# Patient Record
Sex: Male | Born: 1965
Health system: Southern US, Community
[De-identification: ages and names within clinical notes are randomized; demographics above are authoritative.]

## PROBLEM LIST (undated history)

## (undated) DIAGNOSIS — F419 Anxiety disorder, unspecified: Secondary | ICD-10-CM

## (undated) DIAGNOSIS — T7840XA Allergy, unspecified, initial encounter: Secondary | ICD-10-CM

## (undated) DIAGNOSIS — M1990 Unspecified osteoarthritis, unspecified site: Secondary | ICD-10-CM

## (undated) HISTORY — DX: Allergy, unspecified, initial encounter: T78.40XA

## (undated) HISTORY — DX: Anxiety disorder, unspecified: F41.9

## (undated) HISTORY — DX: Unspecified osteoarthritis, unspecified site: M19.90

## (undated) HISTORY — PX: HERNIA REPAIR: SHX51

## (undated) HISTORY — PX: JOINT REPLACEMENT: SHX530

---
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
## 2017-09-15 DIAGNOSIS — H6692 Otitis media, unspecified, left ear: Secondary | ICD-10-CM | POA: Diagnosis not present

## 2017-09-15 DIAGNOSIS — R0982 Postnasal drip: Secondary | ICD-10-CM | POA: Diagnosis not present

## 2017-10-22 LAB — HEMOGLOBIN A1C: Hemoglobin A1C: 5.2

## 2017-10-22 LAB — LIPID PANEL
CHOLESTEROL: 315 — AB (ref 0–200)
HDL: 42 (ref 35–70)
LDL Cholesterol: 228
TRIGLYCERIDES: 223 — AB (ref 40–160)

## 2017-10-22 LAB — BASIC METABOLIC PANEL
CREATININE: 1 (ref 0.6–1.3)
GLUCOSE: 96

## 2017-11-22 ENCOUNTER — Encounter: Payer: Self-pay | Admitting: Family Medicine

## 2017-11-22 ENCOUNTER — Ambulatory Visit (INDEPENDENT_AMBULATORY_CARE_PROVIDER_SITE_OTHER): Payer: BLUE CROSS/BLUE SHIELD | Admitting: Family Medicine

## 2017-11-22 VITALS — BP 140/84 | HR 101 | Temp 98.0°F | Resp 16 | Ht 70.5 in | Wt 215.0 lb

## 2017-11-22 DIAGNOSIS — Z23 Encounter for immunization: Secondary | ICD-10-CM | POA: Diagnosis not present

## 2017-11-22 DIAGNOSIS — F901 Attention-deficit hyperactivity disorder, predominantly hyperactive type: Secondary | ICD-10-CM

## 2017-11-22 DIAGNOSIS — Z9189 Other specified personal risk factors, not elsewhere classified: Secondary | ICD-10-CM | POA: Insufficient documentation

## 2017-11-22 DIAGNOSIS — Z7252 High risk homosexual behavior: Secondary | ICD-10-CM | POA: Insufficient documentation

## 2017-11-22 DIAGNOSIS — E785 Hyperlipidemia, unspecified: Secondary | ICD-10-CM

## 2017-11-22 DIAGNOSIS — H7292 Unspecified perforation of tympanic membrane, left ear: Secondary | ICD-10-CM | POA: Insufficient documentation

## 2017-11-22 DIAGNOSIS — J302 Other seasonal allergic rhinitis: Secondary | ICD-10-CM

## 2017-11-22 DIAGNOSIS — E669 Obesity, unspecified: Secondary | ICD-10-CM | POA: Diagnosis not present

## 2017-11-22 DIAGNOSIS — F988 Other specified behavioral and emotional disorders with onset usually occurring in childhood and adolescence: Secondary | ICD-10-CM | POA: Insufficient documentation

## 2017-11-22 DIAGNOSIS — F411 Generalized anxiety disorder: Secondary | ICD-10-CM

## 2017-11-22 DIAGNOSIS — J3089 Other allergic rhinitis: Secondary | ICD-10-CM | POA: Diagnosis not present

## 2017-11-22 DIAGNOSIS — Z1211 Encounter for screening for malignant neoplasm of colon: Secondary | ICD-10-CM

## 2017-11-22 DIAGNOSIS — H938X2 Other specified disorders of left ear: Secondary | ICD-10-CM

## 2017-11-22 MED ORDER — DIPHENHYDRAMINE HCL 25 MG PO TABS: 25.0000 mg | ORAL_TABLET | Freq: Four times a day (QID) | ORAL | 0 refills | Status: DC | PRN

## 2017-11-22 MED ORDER — LEVOCETIRIZINE DIHYDROCHLORIDE 5 MG PO TABS: 5.0000 mg | ORAL_TABLET | Freq: Every evening | ORAL | 1 refills | Status: DC

## 2017-11-22 MED ORDER — DULOXETINE HCL 30 MG PO CPEP: 30.0000 mg | ORAL_CAPSULE | Freq: Every day | ORAL | 0 refills | Status: DC

## 2017-11-22 MED ORDER — EMTRICITABINE-TENOFOVIR DF 200-300 MG PO TABS: 1.0000 | ORAL_TABLET | Freq: Every day | ORAL | 12 refills | Status: DC

## 2017-11-22 MED ORDER — ASPIRIN EC 81 MG PO TBEC: 81.0000 mg | DELAYED_RELEASE_TABLET | Freq: Every day | ORAL | 0 refills | Status: DC

## 2017-11-22 MED ORDER — MULTI-VITAMIN/MINERALS PO TABS: 1.0000 | ORAL_TABLET | Freq: Every day | ORAL | 0 refills | Status: AC

## 2017-11-22 MED ORDER — ROSUVASTATIN CALCIUM 20 MG PO TABS: 20.0000 mg | ORAL_TABLET | Freq: Every day | ORAL | 2 refills | Status: DC

## 2017-11-22 MED ORDER — MOMETASONE FUROATE 50 MCG/ACT NA SUSP: 2.0000 | Freq: Every day | NASAL | 2 refills | Status: DC

## 2017-11-22 NOTE — Progress Notes (Signed)
Name: Danny Fisher   MRN: [id number]    DOB: Aug 05, 1965   Date:11/22/2017       Progress Note  Subjective  Chief Complaint  Chief Complaint  Patient presents with  . Establish Care    HPI  Dyslipidemia: he denies significant history of heart disease , but mother has dyslipidemia and takes cholesterol medication and grandmother had a stroke in her 61's. He denies chest pain or palpitation. He is over 73 and is vaping with a history of cigarette use in the past. BP elevated today ( he states very anxious) and was 130/81 at health screen at work. His ASCVD is very high with life time risk of 69 % and because LDL above 200, high dose statin is recommended. He is willing to try therapy.  Elevation BP: bp was 130/81 at recent work screening, elevated today but he was nervous, he will return for bp recheck with CMA or in a month with me.   Obesity: discussed importance of life style modification  AR: taking otc medication , he states had an URI in July that was severe since that episode he has intermittent left ear fulness, he has a history of left TM rupture and would like to see ENT. He is taking otc medication for AR for many years, has stuffy nose every night  GAD: he states that he was diagnosed with ADD , he continues to be fidgety, anxious, chews his nails, bites the inside of his gum, has difficulty sleeping without benadryl, never took medication for anxiety, but has some agoraphobia.   Homosexual: married, but states occasional has sex with others, however not recently, discussed [place]    Patient Active Problem List   Diagnosis Date Noted  . Dyslipidemia 11/22/2017  . Tympanic membrane rupture, left 11/22/2017  . Obesity (BMI 30.0-34.9) 11/22/2017  . High risk homosexual behavior 11/22/2017  . ADD (attention deficit disorder) 11/22/2017    Past Surgical History:  Procedure Laterality Date  . INCISIONAL HERNIA REPAIR  1971    Family History  Problem Relation Age of Onset  .  Hyperlipidemia Mother   . Ovarian cancer Maternal Grandmother   . Emphysema Maternal Grandfather        Tobacco User  . Stroke Paternal Grandmother     Social History   Socioeconomic History  . Marital status: Married    Spouse name: [name]  . Number of children: 0  . Years of education: Not on file  . Highest education level: Bachelor's degree (e.g., BA, AB, BS)  Occupational History  . Not on file  Social Needs  . Financial resource strain: Not hard at all  . Food insecurity:    Worry: Never true    Inability: Never true  . Transportation needs:    Medical: No    Non-medical: No  Tobacco Use  . Smoking status: Former Smoker    Packs/day: 0.50    Years: 25.00    Pack years: 12.50    Types: Cigarettes    Start date: 03/22/1984    Last attempt to quit: 11/22/2009    Years since quitting: 8.0  . Smokeless tobacco: Never Used  Substance and Sexual Activity  . Alcohol use: Yes    Frequency: Never    Comment: very seldom   . Drug use: Never  . Sexual activity: Yes    Partners: Male  Lifestyle  . Physical activity:    Days per week: 0 days    Minutes per session: Not on file  .  Stress: Very much  Relationships  . Social connections:    Talks on phone: More than three times a week    Gets together: Once a week    Attends religious service: Never    Active member of club or organization: No    Attends meetings of clubs or organizations: Never    Relationship status: Married  . Intimate partner violence:    Fear of current or ex partner: No    Emotionally abused: No    Physically abused: No    Forced sexual activity: No  Other Topics Concern  . Not on file  Social History Narrative   He is married to Hough, he has working for [employer] for 10 years   Anxiety keeps him from going out often     Current Outpatient Medications:  .  aspirin EC 81 MG tablet, Take 1 tablet (81 mg total) by mouth daily., Disp: 30 tablet, Rfl: 0 .  diphenhydrAMINE (BENADRYL ALLERGY) 25 MG  tablet, Take 1 tablet (25 mg total) by mouth every 6 (six) hours as needed., Disp: 30 tablet, Rfl: 0 .  DULoxetine (CYMBALTA) 30 MG capsule, Take 1-2 capsules (30-60 mg total) by mouth daily. First week and after that 2 daily, Disp: 60 capsule, Rfl: 0 .  emtricitabine-tenofovir (TRUVADA) 200-300 MG tablet, Take 1 tablet by mouth daily., Disp: 30 tablet, Rfl: 12 .  levocetirizine (XYZAL) 5 MG tablet, Take 1 tablet (5 mg total) by mouth every evening., Disp: 90 tablet, Rfl: 1 .  mometasone (NASONEX) 50 MCG/ACT nasal spray, Place 2 sprays into the nose daily., Disp: 17 g, Rfl: 2 .  Multiple Vitamins-Minerals (MULTIVITAMIN WITH MINERALS) tablet, Take 1 tablet by mouth daily., Disp: 30 tablet, Rfl: 0 .  rosuvastatin (CRESTOR) 20 MG tablet, Take 1 tablet (20 mg total) by mouth daily., Disp: 30 tablet, Rfl: 2  No Known Allergies   ROS  Constitutional: Negative for fever or weight change.  Respiratory: Negative for cough and shortness of breath.   Cardiovascular: Negative for chest pain or palpitations.  Gastrointestinal: Negative for abdominal pain, no bowel changes.  Musculoskeletal: Negative for gait problem or joint swelling.  Skin: Negative for rash.  Neurological: Negative for dizziness or headache.  No other specific complaints in a complete review of systems (except as listed in HPI above).  Objective  Vitals:   11/22/17 1501  BP: (!) 144/86  Pulse: (!) 114  Resp: 16  Temp: 98 F (36.7 C)  TempSrc: Oral  SpO2: 98%  Weight: 215 lb (97.5 kg)  Height: 5' 10.5" (1.791 m)    Body mass index is 30.41 kg/m.  Physical Exam  Constitutional: Patient appears well-developed and well-nourished. Obese  No distress.  HEENT: head atraumatic, normocephalic, pupils equal and reactive to light,  neck supple, throat within normal limits, left TM air fluid level and scarring  Cardiovascular: Normal rate, regular rhythm and normal heart sounds.  No murmur heard. No BLE edema. Pulmonary/Chest:  Effort normal and breath sounds normal. No respiratory distress. Abdominal: Soft.  There is no tenderness. Psychiatric: Patient has a normal mood and affect. behavior is normal. Judgment and thought content normal.  Recent Results (from the past 2160 hour(s))  Basic metabolic panel     Status: None   Collection Time: 10/22/17 12:00 AM  Result Value Ref Range   Glucose 96    Creatinine 1.0 0.6 - 1.3  Lipid panel     Status: Abnormal   Collection Time: 10/22/17 12:00 AM  Result  Value Ref Range   Triglycerides 223 (A) 40 - 160   Cholesterol 315 (A) 0 - 200   HDL 42 35 - 70   LDL Cholesterol 228   Hemoglobin A1c     Status: None   Collection Time: 10/22/17 12:00 AM  Result Value Ref Range   Hemoglobin A1C 5.2      PHQ2/9: Depression screen PHQ 2/9 11/22/2017  Decreased Interest 0  Down, Depressed, Hopeless 0  PHQ - 2 Score 0  Altered sleeping 3  Tired, decreased energy 0  Change in appetite 0  Feeling bad or failure about yourself  0  Trouble concentrating 0  Moving slowly or fidgety/restless 2  Suicidal thoughts 0  PHQ-9 Score 5  Difficult doing work/chores Not difficult at all     Fall Risk: Fall Risk  11/22/2017  Falls in the past year? No     Functional Status Survey: Is the patient deaf or have difficulty hearing?: Yes(left ear) Does the patient have difficulty seeing, even when wearing glasses/contacts?: Yes(glasses) Does the patient have difficulty concentrating, remembering, or making decisions?: No Does the patient have difficulty walking or climbing stairs?: No Does the patient have difficulty dressing or bathing?: No Does the patient have difficulty doing errands alone such as visiting a doctor's office or shopping?: No     GAD 7 : Generalized Anxiety Score 11/22/2017  Nervous, Anxious, on Edge 3  Control/stop worrying 1  Worry too much - different things 1  Trouble relaxing 1  Restless 1  Easily annoyed or irritable 0  Afraid - awful might happen 0   Total GAD 7 Score 7  Anxiety Difficulty Not difficult at all      Assessment & Plan  1. Dyslipidemia (high LDL; low HDL)  - rosuvastatin (CRESTOR) 20 MG tablet; Take 1 tablet (20 mg total) by mouth daily.  Dispense: 30 tablet; Refill: 2 - Comprehensive metabolic panel - NMR, lipoprofile - aspirin EC 81 MG tablet; Take 1 tablet (81 mg total) by mouth daily.  Dispense: 30 tablet; Refill: 0 - EKG was normal   2. Obesity (BMI 30-39.9)  Discussed with the patient the risk posed by an increased BMI. Discussed importance of portion control, calorie counting and at least 150 minutes of physical activity weekly. Avoid sweet beverages and drink more water. Eat at least 6 servings of fruit and vegetables daily   3. Need for immunization against influenza  - Flu Vaccine QUAD 6+ mos PF IM (Fluarix Quad PF)  4. Perennial allergic rhinitis with seasonal variation  - levocetirizine (XYZAL) 5 MG tablet; Take 1 tablet (5 mg total) by mouth every evening.  Dispense: 90 tablet; Refill: 1 - mometasone (NASONEX) 50 MCG/ACT nasal spray; Place 2 sprays into the nose daily.  Dispense: 17 g; Refill: 2 - diphenhydrAMINE (BENADRYL ALLERGY) 25 MG tablet; Take 1 tablet (25 mg total) by mouth every 6 (six) hours as needed.  Dispense: 30 tablet; Refill: 0  5. Colon cancer screening  -cologuard  6. At risk for HIV due to homosexual contact  - emtricitabine-tenofovir (TRUVADA) 200-300 MG tablet; Take 1 tablet by mouth daily.  Dispense: 30 tablet; Refill: 12   7. Left ear fullness   Referral ENT    8. Obesity (BMI 30.0-34.9)  Discussed with the patient the risk posed by an increased BMI. Discussed importance of portion control, calorie counting and at least 150 minutes of physical activity weekly. Avoid sweet beverages and drink more water. Eat at least 6 servings of  fruit and vegetables daily    9. Obesity (BMI 30.0-34.9)  Discussed with the patient the risk posed by an increased BMI. Discussed  importance of portion control, calorie counting and at least 150 minutes of physical activity weekly. Avoid sweet beverages and drink more water. Eat at least 6 servings of fruit and vegetables daily   10. GAD (generalized anxiety disorder)  - DULoxetine (CYMBALTA) 30 MG capsule; Take 1-2 capsules (30-60 mg total) by mouth daily. First week and after that 2 daily  Dispense: 60 capsule; Refill: 0   11. Attention deficit hyperactivity disorder (ADHD), predominantly hyperactive type  He tried Ritalin as a child but mother stopped because it made him act like a zombie

## 2017-12-02 DIAGNOSIS — H903 Sensorineural hearing loss, bilateral: Secondary | ICD-10-CM | POA: Diagnosis not present

## 2017-12-02 DIAGNOSIS — J301 Allergic rhinitis due to pollen: Secondary | ICD-10-CM | POA: Diagnosis not present

## 2017-12-02 DIAGNOSIS — H6980 Other specified disorders of Eustachian tube, unspecified ear: Secondary | ICD-10-CM | POA: Diagnosis not present

## 2017-12-13 DIAGNOSIS — Z1211 Encounter for screening for malignant neoplasm of colon: Secondary | ICD-10-CM | POA: Diagnosis not present

## 2017-12-16 DIAGNOSIS — E785 Hyperlipidemia, unspecified: Secondary | ICD-10-CM | POA: Diagnosis not present

## 2017-12-17 LAB — COMPREHENSIVE METABOLIC PANEL WITH GFR
ALT: 23 IU/L (ref 0–44)
AST: 21 IU/L (ref 0–40)
Albumin/Globulin Ratio: 1.9 (ref 1.2–2.2)
Albumin: 4.5 g/dL (ref 3.5–5.5)
Alkaline Phosphatase: 87 IU/L (ref 39–117)
BUN/Creatinine Ratio: 10 (ref 9–20)
BUN: 10 mg/dL (ref 6–24)
Bilirubin Total: 0.5 mg/dL (ref 0.0–1.2)
CO2: 24 mmol/L (ref 20–29)
Calcium: 9.6 mg/dL (ref 8.7–10.2)
Chloride: 101 mmol/L (ref 96–106)
Creatinine, Ser: 1.02 mg/dL (ref 0.76–1.27)
GFR calc Af Amer: 98 mL/min/1.73
GFR calc non Af Amer: 85 mL/min/1.73
Globulin, Total: 2.4 g/dL (ref 1.5–4.5)
Glucose: 95 mg/dL (ref 65–99)
Potassium: 4.4 mmol/L (ref 3.5–5.2)
Sodium: 141 mmol/L (ref 134–144)
Total Protein: 6.9 g/dL (ref 6.0–8.5)

## 2017-12-18 LAB — COLOGUARD: COLOGUARD: NEGATIVE

## 2017-12-19 ENCOUNTER — Encounter: Payer: Self-pay | Admitting: Family Medicine

## 2017-12-19 ENCOUNTER — Other Ambulatory Visit: Payer: Self-pay | Admitting: Family Medicine

## 2017-12-19 DIAGNOSIS — F411 Generalized anxiety disorder: Secondary | ICD-10-CM

## 2017-12-23 ENCOUNTER — Encounter: Payer: Self-pay | Admitting: Family Medicine

## 2017-12-23 ENCOUNTER — Ambulatory Visit: Payer: BLUE CROSS/BLUE SHIELD | Admitting: Family Medicine

## 2017-12-23 VITALS — BP 122/70 | HR 110 | Temp 98.3°F | Resp 16 | Ht 71.0 in | Wt 212.8 lb

## 2017-12-23 DIAGNOSIS — Z79899 Other long term (current) drug therapy: Secondary | ICD-10-CM | POA: Diagnosis not present

## 2017-12-23 DIAGNOSIS — E785 Hyperlipidemia, unspecified: Secondary | ICD-10-CM | POA: Diagnosis not present

## 2017-12-23 DIAGNOSIS — Z113 Encounter for screening for infections with a predominantly sexual mode of transmission: Secondary | ICD-10-CM

## 2017-12-23 DIAGNOSIS — F411 Generalized anxiety disorder: Secondary | ICD-10-CM | POA: Diagnosis not present

## 2017-12-23 DIAGNOSIS — Z9189 Other specified personal risk factors, not elsewhere classified: Secondary | ICD-10-CM

## 2017-12-23 MED ORDER — DULOXETINE HCL 60 MG PO CPEP: 60.0000 mg | ORAL_CAPSULE | Freq: Every day | ORAL | 1 refills | Status: DC

## 2017-12-23 MED ORDER — ROSUVASTATIN CALCIUM 20 MG PO TABS: 20.0000 mg | ORAL_TABLET | Freq: Every day | ORAL | 1 refills | Status: DC

## 2017-12-23 NOTE — Progress Notes (Signed)
Name: Danny Fisher   MRN: [id number]    DOB: 1965-07-11   Date:12/23/2017       Progress Note  Subjective  Chief Complaint  Chief Complaint  Patient presents with  . Follow-up    HPI  GAD: he has been taking Duloxetine for the past month, he states initially it made him feel very sleepy and loopy, but now is really feeling well, states no longer has problems sleeping, does not feel on edge and was able to give a presentation at work without making himself sick prior to event.   Dyslipidemia: he is taking medication and we will recheck levels at [place]   Patient Active Problem List   Diagnosis Date Noted  . Dyslipidemia 11/22/2017  . Tympanic membrane rupture, left 11/22/2017  . Obesity (BMI 30.0-34.9) 11/22/2017  . High risk homosexual behavior 11/22/2017  . ADD (attention deficit disorder) 11/22/2017    Past Surgical History:  Procedure Laterality Date  . INCISIONAL HERNIA REPAIR  1971    Family History  Problem Relation Age of Onset  . Hyperlipidemia Mother   . Ovarian cancer Maternal Grandmother   . Emphysema Maternal Grandfather        Tobacco User  . Stroke Paternal Grandmother     Social History   Socioeconomic History  . Marital status: Married    Spouse name: [name]  . Number of children: 0  . Years of education: Not on file  . Highest education level: Bachelor's degree (e.g., BA, AB, BS)  Occupational History  . Not on file  Social Needs  . Financial resource strain: Not hard at all  . Food insecurity:    Worry: Never true    Inability: Never true  . Transportation needs:    Medical: No    Non-medical: No  Tobacco Use  . Smoking status: Former Smoker    Packs/day: 0.50    Years: 25.00    Pack years: 12.50    Types: Cigarettes    Start date: 03/22/1984    Last attempt to quit: 11/22/2009    Years since quitting: 8.0  . Smokeless tobacco: Never Used  Substance and Sexual Activity  . Alcohol use: Yes    Frequency: Never    Comment: very seldom    . Drug use: Never  . Sexual activity: Yes    Partners: Male  Lifestyle  . Physical activity:    Days per week: 0 days    Minutes per session: Not on file  . Stress: Very much  Relationships  . Social connections:    Talks on phone: More than three times a week    Gets together: Once a week    Attends religious service: Never    Active member of club or organization: No    Attends meetings of clubs or organizations: Never    Relationship status: Married  . Intimate partner violence:    Fear of current or ex partner: No    Emotionally abused: No    Physically abused: No    Forced sexual activity: No  Other Topics Concern  . Not on file  Social History Narrative   He is married to [place]ormick, he has working for [employer] for 10 years   Anxiety keeps him from going out often     Current Outpatient Medications:  .  aspirin EC 81 MG tablet, Take 1 tablet (81 mg total) by mouth daily., Disp: 30 tablet, Rfl: 0 .  diphenhydrAMINE (BENADRYL ALLERGY) 25 MG tablet, Take 1  tablet (25 mg total) by mouth every 6 (six) hours as needed., Disp: 30 tablet, Rfl: 0 .  DULoxetine (CYMBALTA) 60 MG capsule, Take 1 capsule (60 mg total) by mouth daily., Disp: 90 capsule, Rfl: 1 .  emtricitabine-tenofovir (TRUVADA) 200-300 MG tablet, Take 1 tablet by mouth daily., Disp: 30 tablet, Rfl: 12 .  levocetirizine (XYZAL) 5 MG tablet, Take 1 tablet (5 mg total) by mouth every evening., Disp: 90 tablet, Rfl: 1 .  mometasone (NASONEX) 50 MCG/ACT nasal spray, Place 2 sprays into the nose daily., Disp: 17 g, Rfl: 2 .  Multiple Vitamins-Minerals (MULTIVITAMIN WITH MINERALS) tablet, Take 1 tablet by mouth daily., Disp: 30 tablet, Rfl: 0 .  rosuvastatin (CRESTOR) 20 MG tablet, Take 1 tablet (20 mg total) by mouth daily., Disp: 90 tablet, Rfl: 1  No Known Allergies  I personally reviewed active problem list, medication list, allergies, family history, social history with the patient/caregiver  today.   ROS  Constitutional: Negative for fever or significant  weight change.  Respiratory: Negative for cough and shortness of breath.   Cardiovascular: Negative for chest pain or palpitations.  Gastrointestinal: Negative for abdominal pain, no bowel changes.  Musculoskeletal: Negative for gait problem or joint swelling.  Skin: Negative for rash.  Neurological: Negative for dizziness or headache.  No other specific complaints in a complete review of systems (except as listed in HPI above).  Objective  Vitals:   12/23/17 1428  BP: 122/70  Pulse: (!) 110  Resp: 16  Temp: 98.3 F (36.8 C)  TempSrc: Oral  SpO2: 98%  Weight: 212 lb 12.8 oz (96.5 kg)  Height: 5\' 11"  (1.803 m)    Body mass index is 29.68 kg/m.  Physical Exam  Constitutional: Patient appears well-developed and well-nourished. Overweight.  No distress.  HEENT: head atraumatic, normocephalic, pupils equal and reactive to light, neck supple, throat within normal limits Cardiovascular: Normal rate, regular rhythm and normal heart sounds.  No murmur heard. No BLE edema. Pulmonary/Chest: Effort normal and breath sounds normal. No respiratory distress. Abdominal: Soft.  There is no tenderness. Psychiatric: Patient has a normal mood and affect. behavior is normal. Judgment and thought content normal.   Recent Results (from the past 2160 hour(s))  Basic metabolic panel     Status: None   Collection Time: 10/22/17 12:00 AM  Result Value Ref Range   Glucose 96    Creatinine 1.0 0.6 - 1.3  Lipid panel     Status: Abnormal   Collection Time: 10/22/17 12:00 AM  Result Value Ref Range   Triglycerides 223 (A) 40 - 160   Cholesterol 315 (A) 0 - 200   HDL 42 35 - 70   LDL Cholesterol 228   Hemoglobin A1c     Status: None   Collection Time: 10/22/17 12:00 AM  Result Value Ref Range   Hemoglobin A1C 5.2   Cologuard     Status: None   Collection Time: 12/13/17 12:00 AM  Result Value Ref Range   Cologuard Negative    Comprehensive metabolic panel     Status: None   Collection Time: 12/16/17 10:02 AM  Result Value Ref Range   Glucose 95 65 - 99 mg/dL   BUN 10 6 - 24 mg/dL   Creatinine, Ser 1.02 0.76 - 1.27 mg/dL   GFR calc non Af Amer 85 >59 mL/min/1.73   GFR calc Af Amer 98 >59 mL/min/1.73   BUN/Creatinine Ratio 10 9 - 20   Sodium 141 134 - 144 mmol/L  Potassium 4.4 3.5 - 5.2 mmol/L   Chloride 101 96 - 106 mmol/L   CO2 24 20 - 29 mmol/L   Calcium 9.6 8.7 - 10.2 mg/dL   Total Protein 6.9 6.0 - 8.5 g/dL   Albumin 4.5 3.5 - 5.5 g/dL   Globulin, Total 2.4 1.5 - 4.5 g/dL   Albumin/Globulin Ratio 1.9 1.2 - 2.2   Bilirubin Total 0.5 0.0 - 1.2 mg/dL   Alkaline Phosphatase 87 39 - 117 IU/L   AST 21 0 - 40 IU/L   ALT 23 0 - 44 IU/L     PHQ2/9: Depression screen [facility] 2/9 12/23/2017 11/22/2017  Decreased Interest 0 0  Down, Depressed, Hopeless 0 0  PHQ - 2 Score 0 0  Altered sleeping 0 3  Tired, decreased energy 0 0  Change in appetite 0 0  Feeling bad or failure about yourself  0 0  Trouble concentrating 0 0  Moving slowly or fidgety/restless 0 2  Suicidal thoughts 0 0  PHQ-9 Score 0 5  Difficult doing work/chores Not difficult at all Not difficult at all    GAD 7 : Generalized Anxiety Score 12/23/2017 11/22/2017  Nervous, Anxious, on Edge 1 3  Control/stop worrying 0 1  Worry too much - different things 0 1  Trouble relaxing 0 1  Restless 1 1  Easily annoyed or irritable 1 0  Afraid - awful might happen 0 0  Total GAD 7 Score 3 7  Anxiety Difficulty Not difficult at all Not difficult at all      Assessment & Plan  1. GAD (generalized anxiety disorder)  Doing well, felt loopy the first couple of weeks but now really happy on medication  - DULoxetine (CYMBALTA) 60 MG capsule; Take 1 capsule (60 mg total) by mouth daily.  Dispense: 90 capsule; Refill: 1  2. Dyslipidemia (high LDL; low HDL)  - rosuvastatin (CRESTOR) 20 MG tablet; Take 1 tablet (20 mg total) by mouth daily.  Dispense:  90 tablet; Refill: 1 - Lipoprotein Analysis by NMR  3. Routine screening for STI (sexually transmitted infection)  - HIV Antibody (routine testing w rflx) - RPR - Hepatitis, Acute  4. Long-term use of high-risk medication  - COMPLETE METABOLIC PANEL WITH GFR  5. At risk for HIV due to homosexual contact  On Truvada and is doing well

## 2018-01-23 ENCOUNTER — Other Ambulatory Visit: Payer: Self-pay | Admitting: Family Medicine

## 2018-01-23 DIAGNOSIS — E785 Hyperlipidemia, unspecified: Secondary | ICD-10-CM

## 2018-01-23 DIAGNOSIS — F411 Generalized anxiety disorder: Secondary | ICD-10-CM

## 2018-06-05 ENCOUNTER — Other Ambulatory Visit: Payer: Self-pay | Admitting: Family Medicine

## 2018-06-05 DIAGNOSIS — J302 Other seasonal allergic rhinitis: Secondary | ICD-10-CM

## 2018-06-05 DIAGNOSIS — J3089 Other allergic rhinitis: Principal | ICD-10-CM

## 2018-06-16 ENCOUNTER — Other Ambulatory Visit: Payer: Self-pay | Admitting: Family Medicine

## 2018-06-16 DIAGNOSIS — E785 Hyperlipidemia, unspecified: Secondary | ICD-10-CM

## 2018-06-16 DIAGNOSIS — F411 Generalized anxiety disorder: Secondary | ICD-10-CM

## 2018-06-19 NOTE — Telephone Encounter (Signed)
Called patient, no answer. Left VM for him to have labs done and a virtual visit.

## 2018-06-19 NOTE — Telephone Encounter (Signed)
Medication refill sent back to practice [facility] not refilling for this group at this time

## 2018-06-21 DIAGNOSIS — Z79899 Other long term (current) drug therapy: Secondary | ICD-10-CM | POA: Diagnosis not present

## 2018-06-21 DIAGNOSIS — Z9189 Other specified personal risk factors, not elsewhere classified: Secondary | ICD-10-CM | POA: Diagnosis not present

## 2018-06-21 DIAGNOSIS — E785 Hyperlipidemia, unspecified: Secondary | ICD-10-CM | POA: Diagnosis not present

## 2018-06-21 DIAGNOSIS — Z113 Encounter for screening for infections with a predominantly sexual mode of transmission: Secondary | ICD-10-CM | POA: Diagnosis not present

## 2018-06-22 LAB — COMPREHENSIVE METABOLIC PANEL
A/G RATIO: 2.3 — AB (ref 1.2–2.2)
ALK PHOS: 86 IU/L (ref 39–117)
ALT: 75 IU/L — AB (ref 0–44)
AST: 52 IU/L — ABNORMAL HIGH (ref 0–40)
Albumin: 4.5 g/dL (ref 3.8–4.9)
BILIRUBIN TOTAL: 0.4 mg/dL (ref 0.0–1.2)
BUN / CREAT RATIO: 17 (ref 9–20)
BUN: 16 mg/dL (ref 6–24)
CHLORIDE: 103 mmol/L (ref 96–106)
CO2: 27 mmol/L (ref 20–29)
Calcium: 9.2 mg/dL (ref 8.7–10.2)
Creatinine, Ser: 0.96 mg/dL (ref 0.76–1.27)
GFR calc non Af Amer: 91 mL/min/{1.73_m2} (ref >59)
GFR, EST AFRICAN AMERICAN: 105 mL/min/{1.73_m2} (ref >59)
GLUCOSE: 102 mg/dL — AB (ref 65–99)
Globulin, Total: 2 g/dL (ref 1.5–4.5)
POTASSIUM: 4.7 mmol/L (ref 3.5–5.2)
Sodium: 143 mmol/L (ref 134–144)
TOTAL PROTEIN: 6.5 g/dL (ref 6.0–8.5)

## 2018-06-22 LAB — NMR, LIPOPROFILE
Cholesterol, Total: 136 mg/dL (ref 100–199)
HDL Particle Number: 33.4 umol/L (ref >=30.5)
HDL-C: 44 mg/dL (ref >39)
LDL PARTICLE NUMBER: 1031 nmol/L — AB (ref <1000)
LDL SIZE: 20.2 nm — AB (ref >20.5)
LDL-C: 66 mg/dL (ref 0–99)
LP-IR Score: 72 — ABNORMAL HIGH (ref <=45)
SMALL LDL PARTICLE NUMBER: 636 nmol/L — AB (ref <=527)
Triglycerides: 129 mg/dL (ref 0–149)

## 2018-06-22 LAB — HEPATITIS PANEL, ACUTE
HEP A IGM: NEGATIVE
HEP B S AG: NEGATIVE
Hep B C IgM: NEGATIVE

## 2018-06-22 LAB — HIV ANTIBODY (ROUTINE TESTING W REFLEX): HIV Screen 4th Generation wRfx: NONREACTIVE

## 2018-06-22 LAB — RPR: RPR: NONREACTIVE

## 2018-06-27 ENCOUNTER — Encounter: Payer: Self-pay | Admitting: Family Medicine

## 2018-06-27 ENCOUNTER — Other Ambulatory Visit: Payer: Self-pay

## 2018-06-27 ENCOUNTER — Ambulatory Visit (INDEPENDENT_AMBULATORY_CARE_PROVIDER_SITE_OTHER): Payer: BLUE CROSS/BLUE SHIELD | Admitting: Family Medicine

## 2018-06-27 VITALS — Wt 219.0 lb

## 2018-06-27 DIAGNOSIS — J302 Other seasonal allergic rhinitis: Secondary | ICD-10-CM

## 2018-06-27 DIAGNOSIS — E669 Obesity, unspecified: Secondary | ICD-10-CM | POA: Diagnosis not present

## 2018-06-27 DIAGNOSIS — Z9189 Other specified personal risk factors, not elsewhere classified: Secondary | ICD-10-CM

## 2018-06-27 DIAGNOSIS — F411 Generalized anxiety disorder: Secondary | ICD-10-CM | POA: Diagnosis not present

## 2018-06-27 DIAGNOSIS — E785 Hyperlipidemia, unspecified: Secondary | ICD-10-CM | POA: Diagnosis not present

## 2018-06-27 DIAGNOSIS — R748 Abnormal levels of other serum enzymes: Secondary | ICD-10-CM | POA: Diagnosis not present

## 2018-06-27 DIAGNOSIS — J3089 Other allergic rhinitis: Secondary | ICD-10-CM

## 2018-06-27 MED ORDER — ROSUVASTATIN CALCIUM 20 MG PO TABS: 20.0000 mg | ORAL_TABLET | Freq: Every day | ORAL | 1 refills | Status: DC

## 2018-06-27 MED ORDER — DULOXETINE HCL 60 MG PO CPEP: 60.0000 mg | ORAL_CAPSULE | Freq: Every day | ORAL | 1 refills | Status: DC

## 2018-06-27 NOTE — Progress Notes (Signed)
Name: Danny Fisher   MRN: [id number]    DOB: [birth year]   Date:06/27/2018       Progress Note  Subjective  Chief Complaint  Chief Complaint  Patient presents with  . Medication Refill  . Anxiety    Has been well controlled   . Dyslipidemia    Got blood work done last week    I connected with@ on 06/27/18 at  3:20 PM EDT by a video enabled telemedicine application and verified that I am speaking with the correct person using two identifiers.  I discussed the limitations of evaluation and management by telemedicine and the availability of in person appointments. The patient expressed understanding and agreed to proceed. Staff also discussed with the patient that there may be a patient responsible charge related to this service. Patient Location: at work  Provider Location: [facility]  HPI  GAD: he has been taking Duloxetine since Fall 2019,  initially it made him feel very sleepy and loopy, he also had some sexual dysfunction, however all side effects resolved. He states not longer worries all the time, able to sleep well at night and is very pleased with medication.Marland Kitchen He also does not feel on edge.  Dyslipidemia: he is taking medication , reviewed labs done recently with patient, liver enzymes has gone up a little, and advised him to have it rechecked in the next couple of months. He denies nausea  High risk HIV: on Truvada, no side effects HIV negative   Patient Active Problem List   Diagnosis Date Noted  . Dyslipidemia 11/22/2017  . Tympanic membrane rupture, left 11/22/2017  . Obesity (BMI 30.0-34.9) 11/22/2017  . High risk homosexual behavior 11/22/2017  . ADD (attention deficit disorder) 11/22/2017    Past Surgical History:  Procedure Laterality Date  . INCISIONAL HERNIA REPAIR  1971    Family History  Problem Relation Age of Onset  . Hyperlipidemia Mother   . Ovarian cancer Maternal Grandmother   . Emphysema Maternal Grandfather        Tobacco User   . Stroke Paternal Grandmother     Social History   Socioeconomic History  . Marital status: Married    Spouse name: [name]  . Number of children: 0  . Years of education: Not on file  . Highest education level: Bachelor's degree (e.g., BA, AB, BS)  Occupational History  . Not on file  Social Needs  . Financial resource strain: Not hard at all  . Food insecurity:    Worry: Never true    Inability: Never true  . Transportation needs:    Medical: No    Non-medical: No  Tobacco Use  . Smoking status: Former Smoker    Packs/day: 0.50    Years: 25.00    Pack years: 12.50    Types: Cigarettes    Start date: 03/22/1984    Last attempt to quit: 11/22/2009    Years since quitting: 8.6  . Smokeless tobacco: Never Used  Substance and Sexual Activity  . Alcohol use: Yes    Frequency: Never    Comment: very seldom   . Drug use: Never  . Sexual activity: Yes    Partners: Male  Lifestyle  . Physical activity:    Days per week: 0 days    Minutes per session: 0 min  . Stress: Not at all  Relationships  . Social connections:    Talks on phone: More than three times a week    Gets together: Once  a week    Attends religious service: Never    Active member of club or organization: No    Attends meetings of clubs or organizations: Never    Relationship status: Married  . Intimate partner violence:    Fear of current or ex partner: No    Emotionally abused: No    Physically abused: No    Forced sexual activity: No  Other Topics Concern  . Not on file  Social History Narrative   He is married to [place], he has working for [employer] for 10 years     Current Outpatient Medications:  .  aspirin EC 81 MG tablet, Take 1 tablet (81 mg total) by mouth daily., Disp: 30 tablet, Rfl: 0 .  azelastine (ASTELIN) 0.1 % nasal spray, , Disp: , Rfl:  .  diphenhydrAMINE (BENADRYL ALLERGY) 25 MG tablet, Take 1 tablet (25 mg total) by mouth every 6 (six) hours as needed., Disp: 30 tablet, Rfl: 0 .   DULoxetine (CYMBALTA) 60 MG capsule, Take 1 capsule (60 mg total) by mouth daily., Disp: 90 capsule, Rfl: 1 .  emtricitabine-tenofovir (TRUVADA) 200-300 MG tablet, Take 1 tablet by mouth daily., Disp: 30 tablet, Rfl: 12 .  levocetirizine (XYZAL) 5 MG tablet, TAKE 1 TABLET(5 MG) BY MOUTH EVERY EVENING, Disp: 90 tablet, Rfl: 0 .  mometasone (NASONEX) 50 MCG/ACT nasal spray, Place 2 sprays into the nose daily., Disp: 17 g, Rfl: 2 .  Multiple Vitamins-Minerals (MULTIVITAMIN WITH MINERALS) tablet, Take 1 tablet by mouth daily., Disp: 30 tablet, Rfl: 0 .  rosuvastatin (CRESTOR) 20 MG tablet, Take 1 tablet (20 mg total) by mouth daily., Disp: 90 tablet, Rfl: 1  No Known Allergies  I personally reviewed active problem list, medication list, allergies, family history, social history with the patient/caregiver today.   ROS  Constitutional: Negative for fever or weight change.  Respiratory: Negative for cough and shortness of breath.   Cardiovascular: Negative for chest pain or palpitations.  Gastrointestinal: Negative for abdominal pain, no bowel changes.  Musculoskeletal: Negative for gait problem or joint swelling.  Skin: Negative for rash.  Neurological: Negative for dizziness or headache.  No other specific complaints in a complete review of systems (except as listed in HPI above).   Objective  Virtual encounter, vitals not obtained.  There is no height or weight on file to calculate BMI.  Physical Exam  Awake, alert and in no distress   PHQ2/9: Depression screen [facility] 2/9 06/27/2018 12/23/2017 11/22/2017  Decreased Interest 0 0 0  Down, Depressed, Hopeless 0 0 0  PHQ - 2 Score 0 0 0  Altered sleeping 0 0 3  Tired, decreased energy 1 0 0  Change in appetite 0 0 0  Feeling bad or failure about yourself  0 0 0  Trouble concentrating 0 0 0  Moving slowly or fidgety/restless 0 0 2  Suicidal thoughts 0 0 0  PHQ-9 Score 1 0 5  Difficult doing work/chores Not difficult at all Not difficult  at all Not difficult at all   PHQ-2/9 Result is negative.    Fall Risk: Fall Risk  06/27/2018 12/23/2017 11/22/2017  Falls in the past year? 0 No No  Number falls in past yr: 0 - -  Injury with Fall? 0 - -     Assessment & Plan  1. GAD (generalized anxiety disorder)  Doing well, continue medication  - DULoxetine (CYMBALTA) 60 MG capsule; Take 1 capsule (60 mg total) by mouth daily.  Dispense: 90 capsule;  Refill: 1  2. Dyslipidemia (high LDL; low HDL)  - rosuvastatin (CRESTOR) 20 MG tablet; Take 1 tablet (20 mg total) by mouth daily.  Dispense: 90 tablet; Refill: 1  3. Elevated liver enzymes  - Hepatic function panel  4. Obesity (BMI 30-39.9)  Discussed with the patient the risk posed by an increased BMI. Discussed importance of portion control, calorie counting and at least 150 minutes of physical activity weekly. Avoid sweet beverages and drink more water. Eat at least 6 servings of fruit and vegetables daily   5. Perennial allergic rhinitis with seasonal variation  Taking medications and does not need refills at this time  6. At risk for HIV due to homosexual contact  He is taking Truvada and tolerating medication well, HIV negative   I discussed the assessment and treatment plan with the patient. The patient was provided an opportunity to ask questions and all were answered. The patient agreed with the plan and demonstrated an understanding of the instructions.  The patient was advised to call back or seek an in-person evaluation if the symptoms worsen or if the condition fails to improve as anticipated.  I provided 15 minutes of non-face-to-face time during this encounter.

## 2018-07-02 ENCOUNTER — Other Ambulatory Visit: Payer: Self-pay | Admitting: Family Medicine

## 2018-07-02 DIAGNOSIS — F411 Generalized anxiety disorder: Secondary | ICD-10-CM

## 2018-07-02 DIAGNOSIS — E785 Hyperlipidemia, unspecified: Secondary | ICD-10-CM

## 2018-11-20 ENCOUNTER — Other Ambulatory Visit: Payer: Self-pay | Admitting: Family Medicine

## 2018-11-20 DIAGNOSIS — E785 Hyperlipidemia, unspecified: Secondary | ICD-10-CM

## 2018-11-20 DIAGNOSIS — F411 Generalized anxiety disorder: Secondary | ICD-10-CM

## 2018-11-21 NOTE — Telephone Encounter (Signed)
Requested medication (s) are due for refill today: yes  Requested medication (s) are on the active medication list: yes  Last refill:  09/26/2018  Future visit scheduled: yes  Notes to clinic:  Requesting 1 year supply   Requested Prescriptions  Pending Prescriptions Disp Refills   rosuvastatin (CRESTOR) 20 MG tablet [Pharmacy Med Name: ROSUVASTATIN  20MG   TAB] 90 tablet 3    Sig: TAKE 1 TABLET BY MOUTH  DAILY     Cardiovascular:  Antilipid - Statins Failed - 11/20/2018 10:41 PM      Failed - HDL in normal range and within 360 days    HDL  Date Value Ref Range Status  10/22/2017 42 35 - 70 Final         Passed - Total Cholesterol in normal range and within 360 days    Cholesterol  Date Value Ref Range Status  10/22/2017 315 (A) 0 - 200 Final         Passed - LDL in normal range and within 360 days    LDL Cholesterol  Date Value Ref Range Status  10/22/2017 228  Final         Passed - Triglycerides in normal range and within 360 days    Triglycerides  Date Value Ref Range Status  10/22/2017 223 (A) 40 - 160 Final         Passed - Patient is not pregnant      Passed - Valid encounter within last 12 months    Recent Outpatient Visits          4 months ago GAD (generalized anxiety disorder)   [facility] [place], Drue Stager, MD   11 months ago GAD (generalized anxiety disorder)   [facility] [place], Drue Stager, MD   12 months ago Dyslipidemia (high LDL; low HDL)   [facility] Steele Sizer, MD      Future Appointments            In 1 month Ancil Boozer, Drue Stager, MD [facility]            DULoxetine (CYMBALTA) 60 MG capsule [Pharmacy Med Name: DULOXETINE  60MG   CAP] 90 capsule 3    Sig: TAKE 1 CAPSULE BY MOUTH  DAILY     Psychiatry: Antidepressants - SNRI Failed - 11/20/2018 10:41 PM      Failed - Completed PHQ-2 or PHQ-9 in the last 360 days.      Passed - Last BP in normal range    BP Readings from Last 1 Encounters:  12/23/17 122/70         Passed - Valid encounter within last 6 months    Recent Outpatient Visits          4 months ago GAD (generalized anxiety disorder)   [facility] Steele Sizer, MD   11 months ago GAD (generalized anxiety disorder)   [facility], Drue Stager, MD   12 months ago Dyslipidemia (high LDL; low HDL)   [facility] Steele Sizer, MD      Future Appointments            In 1 month Steele Sizer, MD [facility], [facility]

## 2018-12-05 ENCOUNTER — Other Ambulatory Visit: Payer: Self-pay | Admitting: Family Medicine

## 2018-12-05 DIAGNOSIS — Z9189 Other specified personal risk factors, not elsewhere classified: Secondary | ICD-10-CM

## 2018-12-05 NOTE — Telephone Encounter (Signed)
Requested medication (s) are due for refill today: yes  Requested medication (s) are on the active medication list: yes  Last refill:  11/05/2018  Future visit scheduled: yes  Notes to clinic:  Review for refill   Requested Prescriptions  Pending Prescriptions Disp Refills   TRUVADA 200-300 MG tablet [Pharmacy Med Name: TRUVADA  200MG -300MG  TABLETS] 30 tablet 12    Sig: TAKE 1 TABLET BY MOUTH DAILY     Off-Protocol Failed - 12/05/2018  3:20 AM      Failed - Medication not assigned to a protocol, review manually.      Passed - Valid encounter within last 12 months    Recent Outpatient Visits          5 months ago GAD (generalized anxiety disorder)   [facility] Steele Sizer, MD   11 months ago GAD (generalized anxiety disorder)   [facility] Steele Sizer, MD   1 year ago Dyslipidemia (high LDL; low HDL)   [facility] Steele Sizer, MD      Future Appointments            In 3 weeks Steele Sizer, MD [facility], [facility]

## 2018-12-20 ENCOUNTER — Other Ambulatory Visit: Payer: Self-pay | Admitting: Family Medicine

## 2018-12-20 DIAGNOSIS — E785 Hyperlipidemia, unspecified: Secondary | ICD-10-CM

## 2018-12-20 NOTE — Telephone Encounter (Signed)
Requested medication (s) are due for refill today: yes  Requested medication (s) are on the active medication list: yes  Last refill: 11/20/2018  Future visit scheduled: yes  Notes to clinic: review for refill   Requested Prescriptions  Pending Prescriptions Disp Refills   rosuvastatin (CRESTOR) 20 MG tablet [Pharmacy Med Name: ROSUVASTATIN 20MG  TABLETS] 90 tablet 0    Sig: TAKE 1 TABLET(20 MG) BY MOUTH DAILY     Cardiovascular:  Antilipid - Statins Failed - 12/20/2018  3:20 AM      Failed - HDL in normal range and within 360 days    HDL  Date Value Ref Range Status  10/22/2017 42 35 - 70 Final         Passed - Total Cholesterol in normal range and within 360 days    Cholesterol  Date Value Ref Range Status  10/22/2017 315 (A) 0 - 200 Final         Passed - LDL in normal range and within 360 days    LDL Cholesterol  Date Value Ref Range Status  10/22/2017 228  Final         Passed - Triglycerides in normal range and within 360 days    Triglycerides  Date Value Ref Range Status  10/22/2017 223 (A) 40 - 160 Final         Passed - Patient is not pregnant      Passed - Valid encounter within last 12 months    Recent Outpatient Visits          5 months ago GAD (generalized anxiety disorder)   [facility] Steele Sizer, MD   12 months ago GAD (generalized anxiety disorder)   [facility] Steele Sizer, MD   1 year ago Dyslipidemia (high LDL; low HDL)   [facility] Steele Sizer, MD      Future Appointments            In 1 week Steele Sizer, MD [facility], [facility]

## 2018-12-29 ENCOUNTER — Ambulatory Visit: Payer: BLUE CROSS/BLUE SHIELD | Admitting: Family Medicine

## 2018-12-30 ENCOUNTER — Other Ambulatory Visit: Payer: Self-pay | Admitting: Family Medicine

## 2018-12-30 DIAGNOSIS — F411 Generalized anxiety disorder: Secondary | ICD-10-CM

## 2018-12-30 NOTE — Telephone Encounter (Signed)
Duloxetine 60 mg caps refilled 11/21/2018 for 90 caps. Pharmacy requesting a year supply.

## 2019-01-02 ENCOUNTER — Other Ambulatory Visit: Payer: Self-pay | Admitting: Family Medicine

## 2019-01-02 DIAGNOSIS — Z9189 Other specified personal risk factors, not elsewhere classified: Secondary | ICD-10-CM

## 2019-01-12 ENCOUNTER — Encounter: Payer: Self-pay | Admitting: Family Medicine

## 2019-01-12 ENCOUNTER — Other Ambulatory Visit: Payer: Self-pay

## 2019-01-12 ENCOUNTER — Ambulatory Visit: Payer: BLUE CROSS/BLUE SHIELD | Admitting: Family Medicine

## 2019-01-12 VITALS — BP 120/84 | HR 81 | Temp 97.3°F | Resp 16 | Ht 71.0 in | Wt 209.0 lb

## 2019-01-12 DIAGNOSIS — Z9189 Other specified personal risk factors, not elsewhere classified: Secondary | ICD-10-CM | POA: Diagnosis not present

## 2019-01-12 DIAGNOSIS — R748 Abnormal levels of other serum enzymes: Secondary | ICD-10-CM | POA: Diagnosis not present

## 2019-01-12 DIAGNOSIS — Z23 Encounter for immunization: Secondary | ICD-10-CM

## 2019-01-12 DIAGNOSIS — E663 Overweight: Secondary | ICD-10-CM

## 2019-01-12 DIAGNOSIS — E785 Hyperlipidemia, unspecified: Secondary | ICD-10-CM

## 2019-01-12 DIAGNOSIS — R739 Hyperglycemia, unspecified: Secondary | ICD-10-CM

## 2019-01-12 DIAGNOSIS — J302 Other seasonal allergic rhinitis: Secondary | ICD-10-CM

## 2019-01-12 DIAGNOSIS — F411 Generalized anxiety disorder: Secondary | ICD-10-CM

## 2019-01-12 DIAGNOSIS — J3089 Other allergic rhinitis: Secondary | ICD-10-CM

## 2019-01-12 DIAGNOSIS — F901 Attention-deficit hyperactivity disorder, predominantly hyperactive type: Secondary | ICD-10-CM

## 2019-01-12 MED ORDER — DULOXETINE HCL 60 MG PO CPEP: 60.0000 mg | ORAL_CAPSULE | Freq: Every day | ORAL | 1 refills | Status: DC

## 2019-01-12 MED ORDER — TRUVADA 200-300 MG PO TABS: 1.0000 | ORAL_TABLET | Freq: Every day | ORAL | 1 refills | Status: DC

## 2019-01-12 MED ORDER — ROSUVASTATIN CALCIUM 20 MG PO TABS: 20.0000 mg | ORAL_TABLET | Freq: Every day | ORAL | 1 refills | Status: DC

## 2019-01-12 MED ORDER — MONTELUKAST SODIUM 10 MG PO TABS: 10.0000 mg | ORAL_TABLET | Freq: Every day | ORAL | 0 refills | Status: DC

## 2019-01-12 MED ORDER — LEVOCETIRIZINE DIHYDROCHLORIDE 5 MG PO TABS: 5.0000 mg | ORAL_TABLET | Freq: Every evening | ORAL | 1 refills | Status: DC

## 2019-01-12 NOTE — Progress Notes (Signed)
Name: Danny Fisher   MRN: [id number]    DOB: [birth year]   Date:01/12/2019       Progress Note  Subjective  Chief Complaint  Chief Complaint  Patient presents with  . Anxiety  . Medication Refill  . High risk HIV  . Dyslipidemia    HPI  Dyslipidemia: he denies significant history of heart disease , but mother has dyslipidemia and takes cholesterol medication and grandmother had a stroke in her 31's. He denies chest pain or palpitation. He has been compliant with Crestor and tries to eat better, he has also been walking 30 minutes three times a week since his last visit   Overweight. He lost a few pounds and is no longer obese . Advised to continue  life style modification  AR: he states symptoms are worse this time of the year, he is using Astelin, xyzal and still has rhinorrhea, nasal congestion and recently noticing recurrence of ear fullness. He is willing to try singulair, discussed possible side effects and black warning box label   GAD: he states that he was diagnosed with ADD , he is doing great on Duloxetine. He states it has been life changing for him and would like to continue medication. No longer has problems falling asleep. He does not worry constantly. He still picks on his nails, but not as much  Homosexual: married, but open relationship, on Truvada and denies side effects. We will recheck HIV   Patient Active Problem List   Diagnosis Date Noted  . Dyslipidemia 11/22/2017  . Tympanic membrane rupture, left 11/22/2017  . Obesity (BMI 30.0-34.9) 11/22/2017  . High risk homosexual behavior 11/22/2017  . ADD (attention deficit disorder) 11/22/2017    Past Surgical History:  Procedure Laterality Date  . INCISIONAL HERNIA REPAIR  1971    Family History  Problem Relation Age of Onset  . Hyperlipidemia Mother   . Ovarian cancer Maternal Grandmother   . Emphysema Maternal Grandfather        Tobacco User  . Stroke Paternal Grandmother     Social History    Socioeconomic History  . Marital status: Married    Spouse name: [name]  . Number of children: 0  . Years of education: Not on file  . Highest education level: Bachelor's degree (e.g., BA, AB, BS)  Occupational History  . Not on file  Social Needs  . Financial resource strain: Not hard at all  . Food insecurity    Worry: Never true    Inability: Never true  . Transportation needs    Medical: No    Non-medical: No  Tobacco Use  . Smoking status: Former Smoker    Packs/day: 0.50    Years: 25.00    Pack years: 12.50    Types: Cigarettes    Start date: 03/22/1984    Quit date: 11/22/2009    Years since quitting: 9.1  . Smokeless tobacco: Never Used  Substance and Sexual Activity  . Alcohol use: Yes    Frequency: Never    Comment: very seldom   . Drug use: Never  . Sexual activity: Yes    Partners: Male  Lifestyle  . Physical activity    Days per week: 3 days    Minutes per session: 30 min  . Stress: Not at all  Relationships  . Social connections    Talks on phone: More than three times a week    Gets together: Twice a week    Attends religious service: Never  Active member of club or organization: No    Attends meetings of clubs or organizations: Never    Relationship status: Married  . Intimate partner violence    Fear of current or ex partner: No    Emotionally abused: No    Physically abused: No    Forced sexual activity: No  Other Topics Concern  . Not on file  Social History Narrative   He is married to [place], he has working for [employer] for 11 years     Current Outpatient Medications:  .  aspirin EC 81 MG tablet, Take 1 tablet (81 mg total) by mouth daily., Disp: 30 tablet, Rfl: 0 .  azelastine (ASTELIN) 0.1 % nasal spray, , Disp: , Rfl:  .  diphenhydrAMINE (BENADRYL ALLERGY) 25 MG tablet, Take 1 tablet (25 mg total) by mouth every 6 (six) hours as needed., Disp: 30 tablet, Rfl: 0 .  DULoxetine (CYMBALTA) 60 MG capsule, Take 1 capsule (60 mg total) by  mouth daily., Disp: 90 capsule, Rfl: 1 .  emtricitabine-tenofovir (TRUVADA) 200-300 MG tablet, Take 1 tablet by mouth daily., Disp: 90 tablet, Rfl: 1 .  levocetirizine (XYZAL) 5 MG tablet, Take 1 tablet (5 mg total) by mouth every evening., Disp: 90 tablet, Rfl: 1 .  Multiple Vitamins-Minerals (MULTIVITAMIN WITH MINERALS) tablet, Take 1 tablet by mouth daily., Disp: 30 tablet, Rfl: 0 .  rosuvastatin (CRESTOR) 20 MG tablet, Take 1 tablet (20 mg total) by mouth daily., Disp: 90 tablet, Rfl: 1 .  montelukast (SINGULAIR) 10 MG tablet, Take 1 tablet (10 mg total) by mouth at bedtime., Disp: 90 tablet, Rfl: 0  No Known Allergies  I personally reviewed active problem list, medication list, allergies, family history, social history with the patient/caregiver today.   ROS  Constitutional: Negative for fever or weight change.  Respiratory: Negative for cough and shortness of breath.   Cardiovascular: Negative for chest pain or palpitations.  Gastrointestinal: Negative for abdominal pain, no bowel changes.  Musculoskeletal: Negative for gait problem or joint swelling.  Skin: Negative for rash.  Neurological: Negative for dizziness or headache.  No other specific complaints in a complete review of systems (except as listed in HPI above).  Objective  Vitals:   01/12/19 0923  BP: 120/84  Pulse: 81  Resp: 16  Temp: (!) 97.3 F (36.3 C)  TempSrc: Temporal  SpO2: 98%  Weight: 209 lb (94.8 kg)  Height: 5\' 11"  (1.803 m)    Body mass index is 29.15 kg/m.  Physical Exam  Constitutional: Patient appears well-developed and well-nourished. Overweight.  No distress.  HEENT: head atraumatic, normocephalic, pupils equal and reactive to light, Cardiovascular: Normal rate, regular rhythm and normal heart sounds.  No murmur heard. No BLE edema. Pulmonary/Chest: Effort normal and breath sounds normal. No respiratory distress. Abdominal: Soft.  There is no tenderness. Psychiatric: Patient has a  normal mood and affect. behavior is normal. Judgment and thought content normal.  PHQ2/9: Depression screen [facility] 2/9 01/12/2019 06/27/2018 12/23/2017 11/22/2017  Decreased Interest 0 0 0 0  Down, Depressed, Hopeless 0 0 0 0  PHQ - 2 Score 0 0 0 0  Altered sleeping 0 0 0 3  Tired, decreased energy 1 1 0 0  Change in appetite 0 0 0 0  Feeling bad or failure about yourself  0 0 0 0  Trouble concentrating 0 0 0 0  Moving slowly or fidgety/restless 0 0 0 2  Suicidal thoughts 0 0 0 0  PHQ-9 Score 1 1 0  5  Difficult doing work/chores - Not difficult at all Not difficult at all Not difficult at all    phq 9 is negative   Fall Risk: Fall Risk  01/12/2019 06/27/2018 12/23/2017 11/22/2017  Falls in the past year? 0 0 No No  Number falls in past yr: 0 0 - -  Injury with Fall? 0 0 - -   GAD 7 : Generalized Anxiety Score 01/12/2019 12/23/2017 11/22/2017  Nervous, Anxious, on Edge 0 1 3  Control/stop worrying 0 0 1  Worry too much - different things 1 0 1  Trouble relaxing 0 0 1  Restless 0 1 1  Easily annoyed or irritable 1 1 0  Afraid - awful might happen 0 0 0  Total GAD 7 Score 2 3 7   Anxiety Difficulty - Not difficult at all Not difficult at all     Functional Status Survey: Is the patient deaf or have difficulty hearing?: No Does the patient have difficulty seeing, even when wearing glasses/contacts?: No Does the patient have difficulty concentrating, remembering, or making decisions?: No Does the patient have difficulty walking or climbing stairs?: No Does the patient have difficulty dressing or bathing?: No Does the patient have difficulty doing errands alone such as visiting a doctor's office or shopping?: No    Assessment & Plan  1. Dyslipidemia (high LDL; low HDL)  - rosuvastatin (CRESTOR) 20 MG tablet; Take 1 tablet (20 mg total) by mouth daily.  Dispense: 90 tablet; Refill: 1 - Lipoprotein Analysis by NMR - Comprehensive metabolic panel  2. GAD (generalized anxiety  disorder)  - DULoxetine (CYMBALTA) 60 MG capsule; Take 1 capsule (60 mg total) by mouth daily.  Dispense: 90 capsule; Refill: 1  3. Elevated liver enzymes  - CBC with Differential/Platelet - Gamma GT - Comprehensive metabolic panel  4. At risk for HIV due to homosexual contact  - emtricitabine-tenofovir (TRUVADA) 200-300 MG tablet; Take 1 tablet by mouth daily.  Dispense: 90 tablet; Refill: 1 - HIV Antibody (routine testing w rflx)  5. Overweight   Doing well, lost a few pounds   6. Perennial allergic rhinitis with seasonal variation  - levocetirizine (XYZAL) 5 MG tablet; Take 1 tablet (5 mg total) by mouth every evening.  Dispense: 90 tablet; Refill: 1  7. Attention deficit hyperactivity disorder (ADHD), predominantly hyperactive type  Doing well at this time  8. Hyperglycemia  - Hemoglobin A1c

## 2019-03-02 ENCOUNTER — Other Ambulatory Visit: Payer: Self-pay | Admitting: Family Medicine

## 2019-04-09 ENCOUNTER — Other Ambulatory Visit: Payer: Self-pay

## 2019-04-09 DIAGNOSIS — Z9189 Other specified personal risk factors, not elsewhere classified: Secondary | ICD-10-CM

## 2019-04-09 MED ORDER — EMTRICITABINE-TENOFOVIR DF 200-300 MG PO TABS: 1.0000 | ORAL_TABLET | Freq: Every day | ORAL | 1 refills | Status: DC

## 2019-04-25 ENCOUNTER — Other Ambulatory Visit: Payer: Self-pay | Admitting: Family Medicine

## 2019-05-13 ENCOUNTER — Emergency Department: Payer: BC Managed Care – PPO

## 2019-05-13 ENCOUNTER — Encounter: Payer: Self-pay | Admitting: Emergency Medicine

## 2019-05-13 ENCOUNTER — Other Ambulatory Visit: Payer: Self-pay

## 2019-05-13 ENCOUNTER — Emergency Department
Admission: EM | Admit: 2019-05-13 | Discharge: 2019-05-13 | Disposition: A | Discharge status: Home / Self Care | Payer: BC Managed Care – PPO | Attending: Emergency Medicine | Admitting: Emergency Medicine

## 2019-05-13 DIAGNOSIS — Y9389 Activity, other specified: Secondary | ICD-10-CM | POA: Diagnosis not present

## 2019-05-13 DIAGNOSIS — S61452A Open bite of left hand, initial encounter: Secondary | ICD-10-CM | POA: Diagnosis not present

## 2019-05-13 DIAGNOSIS — Y999 Unspecified external cause status: Secondary | ICD-10-CM | POA: Diagnosis not present

## 2019-05-13 DIAGNOSIS — Y929 Unspecified place or not applicable: Secondary | ICD-10-CM | POA: Insufficient documentation

## 2019-05-13 DIAGNOSIS — W540XXA Bitten by dog, initial encounter: Secondary | ICD-10-CM | POA: Diagnosis not present

## 2019-05-13 DIAGNOSIS — S61412A Laceration without foreign body of left hand, initial encounter: Secondary | ICD-10-CM | POA: Diagnosis not present

## 2019-05-13 MED ORDER — AMOXICILLIN-POT CLAVULANATE 875-125 MG PO TABS: 1.0000 | ORAL_TABLET | Freq: Two times a day (BID) | ORAL | 0 refills | Status: AC

## 2019-05-13 MED ORDER — AMOXICILLIN-POT CLAVULANATE 875-125 MG PO TABS
1.0000 | ORAL_TABLET | Freq: Once | ORAL | Status: AC
  Administered 2019-05-13: 1 via ORAL
  Filled 2019-05-13: qty 1

## 2019-05-13 NOTE — ED Notes (Signed)
Pt states he was bitten by his dog- pt states dog is up to date on all his vaccines- pt has multiple puncture wounds to his left hand/forearm

## 2019-05-13 NOTE — Discharge Instructions (Signed)
Your bites are too old to be repaired with stitches.  Steri-Strips will eventually fall off on their own.  Do not get these wet.  Please keep wrist splint on so that you do not rebreak open your wounds.  Please take antibiotics so that you do not develop an infection.  Please return to the emergency department for any worsening of symptoms.

## 2019-05-13 NOTE — ED Triage Notes (Signed)
Pt to ED via POV for dog bite on left forearm. Pt was sent to ED because his arm was still bleeding and was "too deep" per urgent care. Pt has 2 lacerations on the left palm and 4 puncture wounds on left forearm.

## 2019-05-13 NOTE — ED Provider Notes (Signed)
[facility] Emergency Department Provider Note  ____________________________________________  Time seen: Approximately 10:24 AM  I have reviewed the triage vital signs and the nursing notes.   HISTORY  Chief Complaint Animal Bite    HPI Danny Fisher is a 54 y.o. male that presents to the emergency department for evaluation of dog bite to left hand that happened last night about 6 PM.  Patient cleaned wound last night and bandaged hand. Patient went to urgent care this morning.  He states that when they were took off the bandage, it was stuck to his hand and it began to start bleeding again.  Lacerations are now over 58 hours old.  Dogs are up-to-date on rabies vaccinations.  Patient's tetanus shot was updated 6 months ago.  He is not allergic to any antibiotics.   History reviewed. No pertinent past medical history.  Patient Active Problem List   Diagnosis Date Noted  . Dyslipidemia 11/22/2017  . Tympanic membrane rupture, left 11/22/2017  . Obesity (BMI 30.0-34.9) 11/22/2017  . High risk homosexual behavior 11/22/2017  . ADD (attention deficit disorder) 11/22/2017    Past Surgical History:  Procedure Laterality Date  . [place]    Prior to Admission medications   Medication Sig Start Date End Date Taking? Authorizing Provider  amoxicillin-clavulanate (AUGMENTIN) 875-125 MG tablet Take 1 tablet by mouth 2 (two) times daily for 10 days. 05/13/19 05/23/19  Laban Emperor, PA-C  aspirin EC 81 MG tablet Take 1 tablet (81 mg total) by mouth daily. 11/22/17   Steele Sizer, MD  azelastine (ASTELIN) 0.1 % nasal spray  04/22/18   [provider]  diphenhydrAMINE (BENADRYL ALLERGY) 25 MG tablet Take 1 tablet (25 mg total) by mouth every 6 (six) hours as needed. 11/22/17   Steele Sizer, MD  DULoxetine (CYMBALTA) 60 MG capsule Take 1 capsule (60 mg total) by mouth daily. 01/12/19   Steele Sizer, MD  emtricitabine-tenofovir (TRUVADA)  200-300 MG tablet Take 1 tablet by mouth daily. 04/09/19   Steele Sizer, MD  levocetirizine (XYZAL) 5 MG tablet Take 1 tablet (5 mg total) by mouth every evening. 01/12/19   Steele Sizer, MD  montelukast (SINGULAIR) 10 MG tablet TAKE 1 TABLET BY MOUTH AT  BEDTIME 04/25/19   Steele Sizer, MD  Multiple Vitamins-Minerals (MULTIVITAMIN WITH MINERALS) tablet Take 1 tablet by mouth daily. 11/22/17   Steele Sizer, MD  rosuvastatin (CRESTOR) 20 MG tablet Take 1 tablet (20 mg total) by mouth daily. 01/12/19   Steele Sizer, MD    Allergies Patient has no known allergies.  Family History  Problem Relation Age of Onset  . Hyperlipidemia Mother   . Ovarian cancer Maternal Grandmother   . Emphysema Maternal Grandfather        Tobacco User  . Stroke Paternal Grandmother     Social History Social History   Tobacco Use  . Smoking status: Former Smoker    Packs/day: 0.50    Years: 25.00    Pack years: 12.50    Types: Cigarettes    Start date: 03/22/1984    Quit date: 11/22/2009    Years since quitting: 9.4  . Smokeless tobacco: Never Used  Substance Use Topics  . Alcohol use: Yes    Comment: very seldom   . Drug use: Never     Review of Systems  Gastrointestinal:  No nausea, no vomiting.  Musculoskeletal: Positive for hand pain. Skin: Negative for rash, ecchymosis.  Positive for lacerations. Neurological: Negative for headaches,  numbness or tingling   ____________________________________________   PHYSICAL EXAM:  VITAL SIGNS: ED Triage Vitals  Enc Vitals Group     BP 05/13/19 1008 (!) 148/92     Pulse Rate 05/13/19 1008 90     Resp 05/13/19 1008 16     Temp 05/13/19 1011 98.4 F (36.9 C)     Temp src --      SpO2 05/13/19 1008 99 %     Weight --      Height --      Head Circumference --      Peak Flow --      Pain Score 05/13/19 1009 4     Pain Loc --      Pain Edu? --      Excl. in [place]? --      Constitutional: Alert and oriented. Well appearing and in no  acute distress. Eyes: Conjunctivae are normal. PERRL. EOMI. Head: Atraumatic. ENT:      Ears:      Nose: No congestion/rhinnorhea.      Mouth/Throat: Mucous membranes are moist.  Neck: No stridor.  Cardiovascular: Normal rate, regular rhythm.  Good peripheral circulation.  Symmetric radial pulses bilaterally. Respiratory: Normal respiratory effort without tachypnea or retractions. Lungs CTAB. Good air entry to the bases with no decreased or absent breath sounds. Musculoskeletal: Full range of motion to all extremities. No gross deformities appreciated. Neurologic:  Normal speech and language. No gross focal neurologic deficits are appreciated.  Skin:  Skin is warm, dry.  2- 1 cm lacerations to left palm and 1/2 centimeter laceration to left volar wrist.  Surrounding ecchymosis. Psychiatric: Mood and affect are normal. Speech and behavior are normal. Patient exhibits appropriate insight and judgement.   ____________________________________________   LABS (all labs ordered are listed, but only abnormal results are displayed)  Labs Reviewed - No data to display ____________________________________________  EKG   ____________________________________________  RADIOLOGY   DG Hand Complete Left  Result Date: 05/13/2019 CLINICAL DATA:  Dog bite left forearm with lacerations over left hand and forearm. EXAM: LEFT HAND - COMPLETE 3+ VIEW COMPARISON:  None. FINDINGS: There is no evidence of fracture or dislocation. There is no evidence of arthropathy or other focal bone abnormality. Soft tissues are unremarkable. IMPRESSION: Negative. Electronically Signed   By: Marin Olp M.D.   On: 05/13/2019 11:17    ____________________________________________    PROCEDURES  Procedure(s) performed:    Procedures    Medications  amoxicillin-clavulanate (AUGMENTIN) 875-125 MG per tablet 1 tablet (1 tablet Oral Given 05/13/19 1040)      ____________________________________________   INITIAL IMPRESSION / ASSESSMENT AND PLAN / ED COURSE  Pertinent labs & imaging results that were available during my care of the patient were reviewed by me and considered in my medical decision making (see chart for details).  Review of the [place] CSRS was performed in accordance of the [place] prior to dispensing any controlled drugs.   Patient presented to the emergency department for evaluation of dog bite.  Vital signs and exam are reassuring.  Lacerations are now over 51 hours old.  We discussed the large risk of infection by suturing lacerations.  Surgicel was placed to help control bleeding.  Steri-Strips were used to repair the lacerations.  Hand was wrapped.  Splint was placed so that patient does not use his thumb and reopen lacerations.  Patient's tetanus is up-to-date.  Dog's rabies are up-to-date.  Dose of Augmentin was given in the emergency department.  Patient  will be discharged home with prescriptions for Augmentin. Patient is to follow up with primary care as directed. Patient is given ED precautions to return to the ED for any worsening or new symptoms.   Danny Fisher was evaluated in Emergency Department on 05/13/2019 for the symptoms described in the history of present illness. He was evaluated in the context of the global COVID-19 pandemic, which necessitated consideration that the patient might be at risk for infection with the SARS-CoV-2 virus that causes COVID-19. Institutional protocols and algorithms that pertain to the evaluation of patients at risk for COVID-19 are in a state of rapid change based on information released by regulatory bodies including the CDC and federal and state organizations. These policies and algorithms were followed during the patient's care in the ED.  ____________________________________________  FINAL CLINICAL IMPRESSION(S) / ED DIAGNOSES  Final diagnoses:  Dog bite, initial encounter       NEW MEDICATIONS STARTED DURING THIS VISIT:  ED Discharge Orders         Ordered    amoxicillin-clavulanate (AUGMENTIN) 875-125 MG tablet  2 times daily     05/13/19 1203              This chart was dictated using voice recognition software/Dragon. Despite best efforts to proofread, errors can occur which can change the meaning. Any change was purely unintentional.    Laban Emperor, PA-C 05/13/19 1343    Vanessa [place], MD 05/17/19 1524

## 2019-05-19 ENCOUNTER — Other Ambulatory Visit: Payer: Self-pay | Admitting: Family Medicine

## 2019-05-19 DIAGNOSIS — J302 Other seasonal allergic rhinitis: Secondary | ICD-10-CM

## 2019-05-19 DIAGNOSIS — J3089 Other allergic rhinitis: Secondary | ICD-10-CM

## 2019-07-09 ENCOUNTER — Encounter: Payer: Self-pay | Admitting: Family Medicine

## 2019-07-11 ENCOUNTER — Ambulatory Visit: Payer: Self-pay | Admitting: Family Medicine

## 2019-07-21 ENCOUNTER — Other Ambulatory Visit: Payer: Self-pay | Admitting: Family Medicine

## 2019-07-21 DIAGNOSIS — F411 Generalized anxiety disorder: Secondary | ICD-10-CM

## 2019-07-21 DIAGNOSIS — E785 Hyperlipidemia, unspecified: Secondary | ICD-10-CM

## 2019-07-21 NOTE — Telephone Encounter (Signed)
Requested medications are due for refill today?  Yes  Requested medications are on active medication list?  Yes  Last Refill:  01/12/2019  # 90 with one refill  Future visit scheduled?   Yes  Notes to Clinic:  Medication failed RX refill protocol due labs not being within last 360 days.  Last labs were performed on 10/22/2017.

## 2019-07-21 NOTE — Telephone Encounter (Signed)
Requested Prescriptions  Pending Prescriptions Disp Refills  . DULoxetine (CYMBALTA) 60 MG capsule [Pharmacy Med Name: DULOXETINE  60MG   CAP] 30 capsule 0    Sig: TAKE 1 CAPSULE BY MOUTH  DAILY     Psychiatry: Antidepressants - SNRI Failed - 07/21/2019 10:35 PM      Failed - Last BP in normal range    BP Readings from Last 1 Encounters:  05/13/19 140/88         Failed - Valid encounter within last 6 months    Recent Outpatient Visits          6 months ago Dyslipidemia (high LDL; low HDL)   [facility] Steele Sizer, MD   1 year ago GAD (generalized anxiety disorder)   [facility] Steele Sizer, MD   1 year ago GAD (generalized anxiety disorder)   [facility] Steele Sizer, MD   1 year ago Dyslipidemia (high LDL; low HDL)   [facility] Steele Sizer, MD      Future Appointments            In 1 week Steele Sizer, MD [facility] [place]           . rosuvastatin (CRESTOR) 20 MG tablet [Pharmacy Med Name: ROSUVASTATIN  20MG   TAB] 90 tablet 3    Sig: TAKE 1 TABLET BY MOUTH  DAILY     Cardiovascular:  Antilipid - Statins Failed - 07/21/2019 10:35 PM      Failed - Total Cholesterol in normal range and within 360 days    Cholesterol  Date Value Ref Range Status  10/22/2017 315 (A) 0 - 200 Final         Failed - LDL in normal range and within 360 days    LDL Cholesterol  Date Value Ref Range Status  10/22/2017 228  Final         Failed - HDL in normal range and within 360 days    HDL  Date Value Ref Range Status  10/22/2017 42 35 - 70 Final         Failed - Triglycerides in normal range and within 360 days    Triglycerides  Date Value Ref Range Status  10/22/2017 223 (A) 40 - 160 Final         Passed - Patient is not pregnant      Passed - Valid encounter within last 12 months    Recent Outpatient Visits          6 months ago Dyslipidemia (high LDL; low HDL)    [facility] Steele Sizer, MD   1 year ago GAD (generalized anxiety disorder)   [facility] Steele Sizer, MD   1 year ago GAD (generalized anxiety disorder)   [facility] Steele Sizer, MD   1 year ago Dyslipidemia (high LDL; low HDL)   [facility] Steele Sizer, MD      Future Appointments            In 1 week Steele Sizer, MD [facility]

## 2019-07-30 ENCOUNTER — Other Ambulatory Visit: Payer: Self-pay

## 2019-07-30 ENCOUNTER — Encounter: Payer: Self-pay | Admitting: Family Medicine

## 2019-07-30 ENCOUNTER — Ambulatory Visit (INDEPENDENT_AMBULATORY_CARE_PROVIDER_SITE_OTHER): Payer: BC Managed Care – PPO | Admitting: Family Medicine

## 2019-07-30 VITALS — BP 130/84 | HR 95 | Temp 97.7°F | Resp 16 | Ht 69.5 in | Wt 212.6 lb

## 2019-07-30 DIAGNOSIS — R748 Abnormal levels of other serum enzymes: Secondary | ICD-10-CM | POA: Diagnosis not present

## 2019-07-30 DIAGNOSIS — Z0000 Encounter for general adult medical examination without abnormal findings: Secondary | ICD-10-CM | POA: Diagnosis not present

## 2019-07-30 DIAGNOSIS — E785 Hyperlipidemia, unspecified: Secondary | ICD-10-CM

## 2019-07-30 DIAGNOSIS — F901 Attention-deficit hyperactivity disorder, predominantly hyperactive type: Secondary | ICD-10-CM

## 2019-07-30 DIAGNOSIS — R739 Hyperglycemia, unspecified: Secondary | ICD-10-CM

## 2019-07-30 DIAGNOSIS — M21611 Bunion of right foot: Secondary | ICD-10-CM

## 2019-07-30 DIAGNOSIS — J3089 Other allergic rhinitis: Secondary | ICD-10-CM

## 2019-07-30 DIAGNOSIS — Z9189 Other specified personal risk factors, not elsewhere classified: Secondary | ICD-10-CM

## 2019-07-30 DIAGNOSIS — R002 Palpitations: Secondary | ICD-10-CM

## 2019-07-30 DIAGNOSIS — J302 Other seasonal allergic rhinitis: Secondary | ICD-10-CM

## 2019-07-30 DIAGNOSIS — D229 Melanocytic nevi, unspecified: Secondary | ICD-10-CM

## 2019-07-30 DIAGNOSIS — F411 Generalized anxiety disorder: Secondary | ICD-10-CM

## 2019-07-30 MED ORDER — DULOXETINE HCL 60 MG PO CPEP: 60.0000 mg | ORAL_CAPSULE | Freq: Every day | ORAL | 1 refills | Status: DC

## 2019-07-30 MED ORDER — EMTRICITABINE-TENOFOVIR DF 200-300 MG PO TABS: 1.0000 | ORAL_TABLET | Freq: Every day | ORAL | 1 refills | Status: DC

## 2019-07-30 MED ORDER — ROSUVASTATIN CALCIUM 20 MG PO TABS: 20.0000 mg | ORAL_TABLET | Freq: Every day | ORAL | 0 refills | Status: DC

## 2019-07-30 MED ORDER — AZELASTINE HCL 0.1 % NA SOLN: 1.0000 | Freq: Two times a day (BID) | NASAL | 1 refills | Status: DC

## 2019-07-30 NOTE — Patient Instructions (Addendum)
Check shingrix vaccine coverage   Preventive Care 53-53 Years Old, Male Preventive care refers to lifestyle choices and visits with your health care provider that can promote health and wellness. This includes:  A yearly physical exam. This is also called an annual well check.  Regular dental and eye exams.  Immunizations.  Screening for certain conditions.  Healthy lifestyle choices, such as eating a healthy diet, getting regular exercise, not using drugs or products that contain nicotine and tobacco, and limiting alcohol use. What can I expect for my preventive care visit? Physical exam Your health care provider will check:  Height and weight. These may be used to calculate body mass index (BMI), which is a measurement that tells if you are at a healthy weight.  Heart rate and blood pressure.  Your skin for abnormal spots. Counseling Your health care provider may ask you questions about:  Alcohol, tobacco, and drug use.  Emotional well-being.  Home and relationship well-being.  Sexual activity.  Eating habits.  Work and work Statistician. What immunizations do I need?  Influenza (flu) vaccine  This is recommended every year. Tetanus, diphtheria, and pertussis (Tdap) vaccine  You may need a Td booster every 10 years. Varicella (chickenpox) vaccine  You may need this vaccine if you have not already been vaccinated. Zoster (shingles) vaccine  You may need this after age 53. Measles, mumps, and rubella (MMR) vaccine  You may need at least one dose of MMR if you were born in 1957 or later. You may also need a second dose. Pneumococcal conjugate (PCV13) vaccine  You may need this if you have certain conditions and were not previously vaccinated. Pneumococcal polysaccharide (PPSV23) vaccine  You may need one or two doses if you smoke cigarettes or if you have certain conditions. Meningococcal conjugate (MenACWY) vaccine  You may need this if you have certain  conditions. Hepatitis A vaccine  You may need this if you have certain conditions or if you travel or work in places where you may be exposed to hepatitis A. Hepatitis B vaccine  You may need this if you have certain conditions or if you travel or work in places where you may be exposed to hepatitis B. Haemophilus influenzae type b (Hib) vaccine  You may need this if you have certain risk factors. Human papillomavirus (HPV) vaccine  If recommended by your health care provider, you may need three doses over 6 months. You may receive vaccines as individual doses or as more than one vaccine together in one shot (combination vaccines). Talk with your health care provider about the risks and benefits of combination vaccines. What tests do I need? Blood tests  Lipid and cholesterol levels. These may be checked every 5 years, or more frequently if you are over 53 years old.  Hepatitis C test.  Hepatitis B test. Screening  Lung cancer screening. You may have this screening every year starting at age 53 if you have a 30-pack-year history of smoking and currently smoke or have quit within the past 15 years.  Prostate cancer screening. Recommendations will vary depending on your family history and other risks.  Colorectal cancer screening. All adults should have this screening starting at age 53 and continuing until age 53. Your health care provider may recommend screening at age 80 if you are at increased risk. You will have tests every 1-10 years, depending on your results and the type of screening test.  Diabetes screening. This is done by checking your blood sugar (  glucose) after you have not eaten for a while (fasting). You may have this done every 1-3 years.  Sexually transmitted disease (STD) testing. Follow these instructions at home: Eating and drinking  Eat a diet that includes fresh fruits and vegetables, whole grains, lean protein, and low-fat dairy products.  Take vitamin and  mineral supplements as recommended by your health care provider.  Do not drink alcohol if your health care provider tells you not to drink.  If you drink alcohol: ? Limit how much you have to 0-2 drinks a day. ? Be aware of how much alcohol is in your drink. In the [place] one drink equals one 12 oz bottle of beer (355 mL), one 5 oz glass of wine (148 mL), or one 1 oz glass of hard liquor (44 mL). Lifestyle  Take daily care of your teeth and gums.  Stay active. Exercise for at least 30 minutes on 5 or more days each week.  Do not use any products that contain nicotine or tobacco, such as cigarettes, e-cigarettes, and chewing tobacco. If you need help quitting, ask your health care provider.  If you are sexually active, practice safe sex. Use a condom or other form of protection to prevent STIs (sexually transmitted infections).  Talk with your health care provider about taking a low-dose aspirin every day starting at age 50. What's next?  Go to your health care provider once a year for a well check visit.  Ask your health care provider how often you should have your eyes and teeth checked.  Stay up to date on all vaccines. This information is not intended to replace advice given to you by your health care provider. Make sure you discuss any questions you have with your health care provider. Document Revised: 03/02/2018 Document Reviewed: 03/02/2018 Elsevier Patient Education  2020 [employer]  

## 2019-07-30 NOTE — Progress Notes (Signed)
Name: Danny Fisher   MRN: [id number]    DOB: [birth year]   Date:07/30/2019       Progress Note  Subjective  Chief Complaint  Chief Complaint  Patient presents with  . Annual Exam    HPI  Patient presents for annual CPE and follow up  Dyslipidemia:he denies significant history of heart disease , but mother has dyslipidemia and takes cholesterol medication and grandmother had a stroke in her 28's. He denies chest pain but has noticed occasional  Palpitation at night ( only a few times lasts a few seconds and not associated with sob, chest pain or diaphoresis) . He has been compliant with Crestor and eats a balanced diet, he is due for labs today, missed labs ordered in October   AR: symptoms are worse in the Spring, wearing a mask seems to decrease symptoms, He is using his medications. Currently symptoms are under control   GAD: he states that he was diagnosed with ADD , he is doing great on Duloxetine. . No longer has problems falling asleep. He does not worry constantly. He still picks on his nails, but not as much. He still vapes, advised to try to cut down on nicotine amount   Homosexual: married, but open relationship, on Truvada and denies side effects. We will recheck HIV today. He is okay getting tested for gonorrhea and chlamydia   USPSTF grade A and B recommendations:  Diet: lots of chicken, fish, tree nuts, not enough fruit or vegetables  Exercise: active at home, working on his deck and also plants around his pool   Depression: phq 9 is negative Depression screen Danny Fisher 2/9 07/30/2019 01/12/2019 06/27/2018 12/23/2017 11/22/2017  Decreased Interest 0 0 0 0 0  Down, Depressed, Hopeless 0 0 0 0 0  PHQ - 2 Score 0 0 0 0 0  Altered sleeping 0 0 0 0 3  Tired, decreased energy 0 1 1 0 0  Change in appetite 0 0 0 0 0  Feeling bad or failure about yourself  0 0 0 0 0  Trouble concentrating 0 0 0 0 0  Moving slowly or fidgety/restless 0 0 0 0 2  Suicidal thoughts 0 0 0 0 0  PHQ-9  Score 0 1 1 0 5  Difficult doing work/chores - - Not difficult at all Not difficult at all Not difficult at all    Hypertension:  BP Readings from Last 3 Encounters:  07/30/19 130/84  05/13/19 140/88  01/12/19 120/84    Obesity: Wt Readings from Last 3 Encounters:  07/30/19 212 lb 9.6 oz (96.4 kg)  01/12/19 209 lb (94.8 kg)  06/27/18 219 lb (99.3 kg)   BMI Readings from Last 3 Encounters:  07/30/19 30.95 kg/m  01/12/19 29.15 kg/m  06/27/18 30.54 kg/m     Lipids:  Lab Results  Component Value Date   CHOL 315 (A) 10/22/2017   Lab Results  Component Value Date   HDL 42 10/22/2017   Lab Results  Component Value Date   LDLCALC 228 10/22/2017   Lab Results  Component Value Date   TRIG 223 (A) 10/22/2017   No results found for: CHOLHDL No results found for: LDLDIRECT Glucose:  Glucose  Date Value Ref Range Status  06/21/2018 102 (H) 65 - 99 mg/dL Final  12/16/2017 95 65 - 99 mg/dL Final      [facility] Visit from 07/30/2019 in [facility]  AUDIT-C Score  2       Married STD testing  and prevention (HIV/chl/gon/syphilis): he is not interested, except for HIV  Hep C: up to date   Skin cancer: Discussed monitoring for atypical lesions Colorectal cancer: up to date repeat in 2022  Prostate cancer:   IPSS Questionnaire (AUA-7): Over the past month.   1)  How often have you had a sensation of not emptying your bladder completely after you finish urinating?  0 - Not at all  2)  How often have you had to urinate again less than two hours after you finished urinating? 0 - Not at all  3)  How often have you found you stopped and started again several times when you urinated?  0 - Not at all  4) How difficult have you found it to postpone urination?  0 - Not at all  5) How often have you had a weak urinary stream?  0 - Not at all  6) How often have you had to push or strain to begin urination?  0 - Not at all  7) How many times did you most  typically get up to urinate from the time you went to bed until the time you got up in the morning?  0 - None  Total score:  0-7 mildly symptomatic   8-19 moderately symptomatic   20-35 severely symptomatic    Lung cancer:  Low Dose CT Chest recommended if Age 64-80 years, 30 pack-year currently smoking OR have quit w/in 15years. Patient does not qualify.   AAA: The USPSTF recommends one-time screening with ultrasonography in men ages 74 to 64 years who have ever smoked ECG:  11/2017  Advanced Care Planning: A voluntary discussion about advance care planning including the explanation and discussion of advance directives.  Discussed health care proxy and Living will, and the patient was able to identify a health care proxy as Danny Fisher   Patient does not have a living will at present time.  Patient Active Problem List   Diagnosis Date Noted  . Dyslipidemia 11/22/2017  . Tympanic membrane rupture, left 11/22/2017  . Obesity (BMI 30.0-34.9) 11/22/2017  . High risk homosexual behavior 11/22/2017  . ADD (attention deficit disorder) 11/22/2017    Past Surgical History:  Procedure Laterality Date  . INCISIONAL HERNIA REPAIR  1971    Family History  Problem Relation Age of Onset  . Hyperlipidemia Mother   . Ovarian cancer Maternal Grandmother   . Emphysema Maternal Grandfather        Tobacco User  . Stroke Paternal Grandmother     Social History   Socioeconomic History  . Marital status: Married    Spouse name: Danny Fisher  . Number of children: 0  . Years of education: Not on file  . Highest education level: Bachelor's degree (e.g., BA, AB, BS)  Occupational History  . Occupation: Optician, dispensing   Tobacco Use  . Smoking status: Former Smoker    Packs/day: 0.50    Years: 25.00    Pack years: 12.50    Types: Cigarettes    Start date: 03/22/1984    Quit date: 11/22/2009    Years since quitting: 9.6  . Smokeless tobacco: Never Used  Substance and Sexual Activity  . Alcohol use:  Yes    Comment: very seldom   . Drug use: Never  . Sexual activity: Yes    Partners: Male  Other Topics Concern  . Not on file  Social History Narrative   He is married to Danny Fisher, he has working for Danny Fisher for since  2009    Social Determinants of Health   Financial Resource Strain: Low Risk   . Difficulty of Paying Living Expenses: Not hard at all  Food Insecurity: No Food Insecurity  . Worried About Charity fundraiser in the Last Year: Never true  . Ran Out of Food in the Last Year: Never true  Transportation Needs: No Transportation Needs  . Lack of Transportation (Medical): No  . Lack of Transportation (Non-Medical): No  Physical Activity: Sufficiently Active  . Days of Exercise per Week: 5 days  . Minutes of Exercise per Session: 60 min  Stress: No Stress Concern Present  . Feeling of Stress : Not at all  Social Connections: Slightly Isolated  . Frequency of Communication with Friends and Family: More than three times a week  . Frequency of Social Gatherings with Friends and Family: More than three times a week  . Attends Religious Services: Never  . Active Member of Clubs or Organizations: No  . Attends Archivist Meetings: More than 4 times per year  . Marital Status: Married  Human resources officer Violence: Not At Risk  . Fear of Current or Ex-Partner: No  . Emotionally Abused: No  . Physically Abused: No  . Sexually Abused: No     Current Outpatient Medications:  .  rosuvastatin (CRESTOR) 20 MG tablet, TAKE 1 TABLET BY MOUTH  DAILY, Disp: 90 tablet, Rfl: 0 .  aspirin EC 81 MG tablet, Take 1 tablet (81 mg total) by mouth daily., Disp: 30 tablet, Rfl: 0 .  azelastine (ASTELIN) 0.1 % nasal spray, , Disp: , Rfl:  .  diphenhydrAMINE (BENADRYL ALLERGY) 25 MG tablet, Take 1 tablet (25 mg total) by mouth every 6 (six) hours as needed., Disp: 30 tablet, Rfl: 0 .  DULoxetine (CYMBALTA) 60 MG capsule, TAKE 1 CAPSULE BY MOUTH  DAILY, Disp: 30 capsule, Rfl: 0 .   emtricitabine-tenofovir (TRUVADA) 200-300 MG tablet, Take 1 tablet by mouth daily., Disp: 90 tablet, Rfl: 1 .  levocetirizine (XYZAL) 5 MG tablet, TAKE 1 TABLET BY MOUTH IN  THE EVENING, Disp: 90 tablet, Rfl: 1 .  montelukast (SINGULAIR) 10 MG tablet, TAKE 1 TABLET BY MOUTH AT  BEDTIME, Disp: 90 tablet, Rfl: 3 .  Multiple Vitamins-Minerals (MULTIVITAMIN WITH MINERALS) tablet, Take 1 tablet by mouth daily., Disp: 30 tablet, Rfl: 0  No Known Allergies   ROS  Constitutional: Negative for fever , positive for mild weight change.  Respiratory: Negative for cough and shortness of breath.   Cardiovascular: Negative for chest pain , positive for intermittent  Palpitations - lasts a few seconds at night.  Gastrointestinal: Negative for abdominal pain, no bowel changes.  Musculoskeletal: Negative for gait problem, positive for right first right toe  joint swelling.  Skin: Negative for rash.  Neurological: Negative for dizziness or headache.  No other specific complaints in a complete review of systems (except as listed in HPI above).   Objective  Vitals:   07/30/19 1449  BP: 130/84  Pulse: 95  Resp: 16  Temp: 97.7 F (36.5 C)  TempSrc: Temporal  SpO2: 94%  Weight: 212 lb 9.6 oz (96.4 kg)  Height: 5' 9.5" (1.765 m)    Body mass index is 30.95 kg/m.  Physical Exam  Constitutional: Patient appears well-developed and well-nourished. No distress.  HENT: Head: Normocephalic and atraumatic. Ears: B TMs ok, no erythema or effusion; Nose: ot done Mouth/Throat: not done Eyes: Conjunctivae and EOM are normal. Pupils are equal, round, and reactive to  light. No scleral icterus.  Neck: Normal range of motion. Neck supple. No JVD present. No thyromegaly present.  Cardiovascular: Normal rate, regular rhythm and normal heart sounds.  No murmur heard. No BLE edema. Pulmonary/Chest: Effort normal and breath sounds normal. No respiratory distress. Abdominal: Soft. Bowel sounds are normal, no  distension. There is no tenderness. no masses MALE GENITALIA: Normal descended testes bilaterally, no masses palpated, no hernias, no lesions, no discharge RECTAL: Prostate not done  Musculoskeletal: Normal range of motion, no joint effusions. Bunion on right foot  Neurological: he is alert and oriented to person, place, and time. No cranial nerve deficit. Coordination, balance, strength, speech and gait are normal.  Skin: Skin is warm and dry. No rash noted. No erythema. Multiple moles on his back  Psychiatric: Patient has a normal mood and affect. behavior is normal. Judgment and thought content normal.  PHQ2/9: Depression screen [facility] 2/9 07/30/2019 01/12/2019 06/27/2018 12/23/2017 11/22/2017  Decreased Interest 0 0 0 0 0  Down, Depressed, Hopeless 0 0 0 0 0  PHQ - 2 Score 0 0 0 0 0  Altered sleeping 0 0 0 0 3  Tired, decreased energy 0 1 1 0 0  Change in appetite 0 0 0 0 0  Feeling bad or failure about yourself  0 0 0 0 0  Trouble concentrating 0 0 0 0 0  Moving slowly or fidgety/restless 0 0 0 0 2  Suicidal thoughts 0 0 0 0 0  PHQ-9 Score 0 1 1 0 5  Difficult doing work/chores - - Not difficult at all Not difficult at all Not difficult at all     Fall Risk: Fall Risk  07/30/2019 01/12/2019 06/27/2018 12/23/2017 11/22/2017  Falls in the past year? 0 0 0 No No  Number falls in past yr: 0 0 0 - -  Injury with Fall? 0 0 0 - -      Functional Status Survey: Is the patient deaf or have difficulty hearing?: No Does the patient have difficulty seeing, even when wearing glasses/contacts?: No Does the patient have difficulty concentrating, remembering, or making decisions?: No Does the patient have difficulty walking or climbing stairs?: No Does the patient have difficulty dressing or bathing?: No Does the patient have difficulty doing errands alone such as visiting a doctor's office or shopping?: No    Assessment & Plan   2. At risk for HIV due to homosexual contact  - GC/Chlamydia  Probe Amp([facility]) - emtricitabine-tenofovir (TRUVADA) 200-300 MG tablet; Take 1 tablet by mouth daily.  Dispense: 90 tablet; Refill: 1 - GC/Chlamydia Probe Amp([facility]) - RPR - TSH  3. Dyslipidemia (high LDL; low HDL)  - rosuvastatin (CRESTOR) 20 MG tablet; Take 1 tablet (20 mg total) by mouth daily.  Dispense: 90 tablet; Refill: 0  4. Hyperglycemia   5. Elevated liver enzymes   6. Attention deficit hyperactivity disorder (ADHD), predominantly hyperactive type   7. GAD (generalized anxiety disorder)  - DULoxetine (CYMBALTA) 60 MG capsule; Take 1 capsule (60 mg total) by mouth daily.  Dispense: 90 capsule; Refill: 1  8. Bunion of great toe of right foot  - Ambulatory referral to Podiatry  9. Perennial allergic rhinitis with seasonal variation  - azelastine (ASTELIN) 0.1 % nasal spray; Place 1 spray into both nostrils 2 (two) times daily.  Dispense: 90 mL; Refill: 1  10. Palpitation  TSH  11. Multiple atypical skin moles  - Ambulatory referral to Dermatology   -Prostate cancer screening and PSA options (with potential  risks and benefits of testing vs not testing) were discussed along with recent recs/guidelines. -USPSTF grade A and B recommendations reviewed with patient; age-appropriate recommendations, preventive care, screening tests, etc discussed and encouraged; healthy living encouraged; see AVS for patient education given to patient -Discussed importance of 150 minutes of physical activity weekly, eat two servings of fish weekly, eat one serving of tree nuts ( cashews, pistachios, pecans, almonds.Marland Kitchen) every other day, eat 6 servings of fruit/vegetables daily and drink plenty of water and avoid sweet beverages.

## 2019-07-31 DIAGNOSIS — Z9189 Other specified personal risk factors, not elsewhere classified: Secondary | ICD-10-CM | POA: Diagnosis not present

## 2019-08-02 LAB — GC/CHLAMYDIA PROBE AMP
Chlamydia trachomatis, NAA: NEGATIVE
Chlamydia trachomatis, NAA: NEGATIVE
Neisseria Gonorrhoeae by PCR: NEGATIVE
Neisseria Gonorrhoeae by PCR: NEGATIVE

## 2019-08-22 ENCOUNTER — Ambulatory Visit: Payer: BC Managed Care – PPO | Admitting: Podiatry

## 2019-08-22 ENCOUNTER — Other Ambulatory Visit: Payer: Self-pay

## 2019-08-22 ENCOUNTER — Ambulatory Visit (INDEPENDENT_AMBULATORY_CARE_PROVIDER_SITE_OTHER): Payer: BC Managed Care – PPO

## 2019-08-22 ENCOUNTER — Encounter: Payer: Self-pay | Admitting: Podiatry

## 2019-08-22 DIAGNOSIS — M2011 Hallux valgus (acquired), right foot: Secondary | ICD-10-CM | POA: Diagnosis not present

## 2019-08-22 DIAGNOSIS — M778 Other enthesopathies, not elsewhere classified: Secondary | ICD-10-CM

## 2019-08-22 DIAGNOSIS — M205X1 Other deformities of toe(s) (acquired), right foot: Secondary | ICD-10-CM

## 2019-08-22 MED ORDER — METHYLPREDNISOLONE 4 MG PO TBPK
ORAL_TABLET | ORAL | 0 refills | Status: DC
Start: 2019-08-22 — End: 2019-12-19

## 2019-08-22 MED ORDER — MELOXICAM 15 MG PO TABS
15.0000 mg | ORAL_TABLET | Freq: Every day | ORAL | 3 refills | Status: DC
Start: 2019-08-22 — End: 2019-12-17

## 2019-08-24 NOTE — Progress Notes (Signed)
Subjective:  Patient ID: Danny Fisher, male    DOB: Jan 18, 1966,  MRN: [id number] HPI Chief Complaint  Patient presents with   Foot Pain    Patient presents today for pain in right hallux 1st MPJ joint x 2-3 months. He states "its stiff and painful in the mornings and throbs"  He has been taking Aleve and soaking his foot in a cold pool"    54 y.o. male presents with the above complaint.   ROS: Denies fever chills nausea vomiting muscle aches pains calf pain back pain chest pain shortness of breath.  No past medical history on file. Past Surgical History:  Procedure Laterality Date   INCISIONAL HERNIA REPAIR  1971    Current Outpatient Medications:    aspirin EC 81 MG tablet, Take 1 tablet (81 mg total) by mouth daily., Disp: 30 tablet, Rfl: 0   azelastine (ASTELIN) 0.1 % nasal spray, Place 1 spray into both nostrils 2 (two) times daily., Disp: 90 mL, Rfl: 1   diphenhydrAMINE (BENADRYL ALLERGY) 25 MG tablet, Take 1 tablet (25 mg total) by mouth every 6 (six) hours as needed., Disp: 30 tablet, Rfl: 0   DULoxetine (CYMBALTA) 60 MG capsule, Take 1 capsule (60 mg total) by mouth daily., Disp: 90 capsule, Rfl: 1   emtricitabine-tenofovir (TRUVADA) 200-300 MG tablet, Take 1 tablet by mouth daily., Disp: 90 tablet, Rfl: 1   levocetirizine (XYZAL) 5 MG tablet, TAKE 1 TABLET BY MOUTH IN  THE EVENING, Disp: 90 tablet, Rfl: 1   meloxicam (MOBIC) 15 MG tablet, Take 1 tablet (15 mg total) by mouth daily., Disp: 30 tablet, Rfl: 3   methylPREDNISolone (MEDROL DOSEPAK) 4 MG TBPK tablet, 6 day dose pack - take as directed, Disp: 21 tablet, Rfl: 0   montelukast (SINGULAIR) 10 MG tablet, TAKE 1 TABLET BY MOUTH AT  BEDTIME, Disp: 90 tablet, Rfl: 3   Multiple Vitamins-Minerals (MULTIVITAMIN WITH MINERALS) tablet, Take 1 tablet by mouth daily., Disp: 30 tablet, Rfl: 0   rosuvastatin (CRESTOR) 20 MG tablet, Take 1 tablet (20 mg total) by mouth daily., Disp: 90 tablet, Rfl: 0  No Known  Allergies Review of Systems Objective:  There were no vitals filed for this visit.  General: Well developed, nourished, in no acute distress, alert and oriented x3   Dermatological: Skin is warm, dry and supple bilateral. Nails x 10 are well maintained; remaining integument appears unremarkable at this time. There are no open sores, no preulcerative lesions, no rash or signs of infection present.  Vascular: Dorsalis Pedis artery and Posterior Tibial artery pedal pulses are 2/4 bilateral with immedate capillary fill time. Pedal hair growth present. No varicosities and no lower extremity edema present bilateral.   Neruologic: Grossly intact via light touch bilateral. Vibratory intact via tuning fork bilateral. Protective threshold with Semmes Wienstein monofilament intact to all pedal sites bilateral. Patellar and Achilles deep tendon reflexes 2+ bilateral. No Babinski or clonus noted bilateral.   Musculoskeletal: No gross boney pedal deformities bilateral. No pain, crepitus, or limitation noted with foot and ankle range of motion bilateral. Muscular strength 5/5 in all groups tested bilateral.  He has pain on palpation with limited range of motion and crepitus right hallux first metatarsophalangeal joint.  Gait: Unassisted, Nonantalgic.    Radiographs:  Radiographs taken today of the right foot demonstrate osseously mature individual.  Significant findings consist of no acute findings.  There is an area of osteoarthritic change at the level of the first metatarsophalangeal joint.  Demonstrated by joint  space narrowing flattening of the head of the first metatarsal dorsal spurring of the first metatarsal and dorsal spurring of the base of the proximal phalanx.  All this is consistent with osteoarthritis and the cystic changes that are noticed with the subchondral sclerosis.  Assessment & Plan:   Assessment: Capsulitis osteoarthritis hallux limitus first metatarsophalangeal joint right  foot.  Plan: We discussed the etiology pathology conservative versus surgical therapies.  At this point he would like to try an injection with oral medication.  We injected the area today into the dorsal aspect of the joint with 10 mg of Kenalog 5 mg of Marcaine.  He tolerated procedure well without a lot of complication.  It was a little tender for him.  Started him on methylprednisolone to be followed by meloxicam.  We discussed stiff soled shoes.  I will follow-up with him in the near future for further evaluation and treatment.     Danyele Smejkal T. [place], [place]

## 2019-09-26 ENCOUNTER — Other Ambulatory Visit: Payer: Self-pay

## 2019-09-26 ENCOUNTER — Ambulatory Visit: Payer: BC Managed Care – PPO | Admitting: Podiatry

## 2019-09-26 ENCOUNTER — Encounter: Payer: Self-pay | Admitting: Podiatry

## 2019-09-26 DIAGNOSIS — M205X1 Other deformities of toe(s) (acquired), right foot: Secondary | ICD-10-CM

## 2019-09-26 MED ORDER — CELECOXIB 200 MG PO CAPS
200.0000 mg | ORAL_CAPSULE | Freq: Every day | ORAL | 2 refills | Status: DC
Start: 2019-09-26 — End: 2019-12-17

## 2019-09-26 NOTE — Progress Notes (Signed)
He presents today for follow-up of his chronic pain to the first metatarsophalangeal joint of the right foot.  He states that honestly I am ready to get this thing fixed to starting to affect my ability to do my job and enjoy life.  He states that I am too young and I want to continue to be able to get around with all without all this pain.  ROS: He denies fever chills nausea vomiting muscle aches pains Back pain calf pain chest pain shortness of breath.  No change in medications.  Objective: Vital signs are stable he is alert and oriented x3 has severe pain on palpation of the first metatarsophalangeal joint.  There is crepitation noted.  Pulses are strong and palpable.  Assessment: Osteoarthritis hallux limitus first metatarsophalangeal joint right foot.  Plan: Discussed etiology pathology conservative surgical therapies at this point time consented him for [facility] arthroplasty single silicone implant right foot.  He understands this is amenable to it.  I discussed with him in great detail today the pros and cons of the surgery as well as the possible complications which may include but not limited to postop pain bleeding swelling infection recurrence need for further surgery overcorrection under correction other options including fusion and osteotomies.  He understands this and is amenable to it.  Dispensed information regarding the surgery center anesthesia group and a cam walker for surgery.

## 2019-09-27 ENCOUNTER — Telehealth: Payer: Self-pay

## 2019-09-27 NOTE — Telephone Encounter (Signed)
Received surgery paperwork from [place] office. Left a message for Danny Fisher to call and schedule surgery with Dr. Milinda Pointer.

## 2019-10-10 ENCOUNTER — Other Ambulatory Visit: Payer: Self-pay | Admitting: Family Medicine

## 2019-10-10 DIAGNOSIS — E785 Hyperlipidemia, unspecified: Secondary | ICD-10-CM

## 2019-10-10 NOTE — Telephone Encounter (Signed)
Requested  medications are  due for refill today yes  Requested medications are on the active medication list yes  Last refill 5/31  Last visit 07/30/19  Future visit scheduled 01/2020  Notes to clinic Failed protocol due to no labs within one year. Did not have labs drawn in Oct or May as ordered.

## 2019-10-11 ENCOUNTER — Other Ambulatory Visit: Payer: Self-pay

## 2019-11-19 ENCOUNTER — Ambulatory Visit: Payer: BC Managed Care – PPO | Admitting: Dermatology

## 2019-11-19 ENCOUNTER — Other Ambulatory Visit: Payer: Self-pay

## 2019-11-19 DIAGNOSIS — D225 Melanocytic nevi of trunk: Secondary | ICD-10-CM | POA: Diagnosis not present

## 2019-11-19 DIAGNOSIS — D1801 Hemangioma of skin and subcutaneous tissue: Secondary | ICD-10-CM | POA: Diagnosis not present

## 2019-11-19 DIAGNOSIS — Z1283 Encounter for screening for malignant neoplasm of skin: Secondary | ICD-10-CM

## 2019-11-19 DIAGNOSIS — L578 Other skin changes due to chronic exposure to nonionizing radiation: Secondary | ICD-10-CM

## 2019-11-19 DIAGNOSIS — L821 Other seborrheic keratosis: Secondary | ICD-10-CM

## 2019-11-19 DIAGNOSIS — D485 Neoplasm of uncertain behavior of skin: Secondary | ICD-10-CM | POA: Diagnosis not present

## 2019-11-19 DIAGNOSIS — L814 Other melanin hyperpigmentation: Secondary | ICD-10-CM | POA: Diagnosis not present

## 2019-11-19 DIAGNOSIS — D229 Melanocytic nevi, unspecified: Secondary | ICD-10-CM

## 2019-11-19 NOTE — Patient Instructions (Signed)

## 2019-11-19 NOTE — Progress Notes (Signed)
   New Patient Visit  Referral from Dr. Steele Sizer  Subjective  Danny Fisher is a 54 y.o. male who presents for the following: Other (Mole on right side. He has not noticed any changes and it has been there for years.). The patient presents for Upper Body Skin Exam (UBSE) for skin cancer screening and mole check.  The following portions of the chart were reviewed this encounter and updated as appropriate:  Tobacco  Allergies  Meds  Problems  Med Hx  Surg Hx  Fam Hx     Review of Systems:  No other skin or systemic complaints except as noted in HPI or Assessment and Plan.  Objective  Well appearing patient in no apparent distress; mood and affect are within normal limits.  All skin waist up examined.  Objective  Right Flank, trunk: Red papules  Objective  Right Lower Back above waistline: 0.6 cm irregular brown macule   Assessment & Plan    Lentigines - Scattered tan macules - Discussed due to sun exposure - Benign, observe - Call for any changes  Seborrheic Keratoses - Stuck-on, waxy, tan-brown papules and plaques  - Discussed benign etiology and prognosis. - Observe - Call for any changes  Melanocytic Nevi - Tan-brown and/or pink-flesh-colored symmetric macules and papules - Benign appearing on exam today - Observation - Call clinic for new or changing moles - Recommend daily use of broad spectrum spf 30+ sunscreen to sun-exposed areas.   Actinic Damage - diffuse scaly erythematous macules with underlying dyspigmentation - Recommend daily broad spectrum sunscreen SPF 30+ to sun-exposed areas, reapply every 2 hours as needed.  - Call for new or changing lesions.  Skin cancer screening performed today.  Hemangioma of skin Right Flank, trunk Benign-appearing.  Observation.  Call clinic for new or changing moles.  Recommend daily use of broad spectrum spf 30+ sunscreen to sun-exposed areas.  Benign.  Neoplasm of uncertain behavior of skin Right  Lower Back above waistline  Epidermal / dermal shaving  Lesion diameter (cm):  0.6 Informed consent: discussed and consent obtained   Timeout: patient name, date of birth, surgical site, and procedure verified   Procedure prep:  Patient was prepped and draped in usual sterile fashion Prep type:  Isopropyl alcohol Anesthesia: the lesion was anesthetized in a standard fashion   Anesthetic:  1% lidocaine w/ epinephrine 1-100,000 buffered w/ 8.4% NaHCO3 Instrument used: flexible razor blade   Hemostasis achieved with: pressure, aluminum chloride and electrodesiccation   Outcome: patient tolerated procedure well   Post-procedure details: sterile dressing applied and wound care instructions given   Dressing type: bandage and petrolatum    Anatomic Pathology Report  Return if symptoms worsen or fail to improve.   I, Ashok Cordia, CMA, am acting as scribe for Sarina Ser, MD .  Documentation: I have reviewed the above documentation for accuracy and completeness, and I agree with the above.  Sarina Ser, MD

## 2019-11-23 ENCOUNTER — Encounter: Payer: Self-pay | Admitting: Dermatology

## 2019-11-24 LAB — ANATOMIC PATHOLOGY REPORT

## 2019-11-24 LAB — SPECIMEN STATUS REPORT

## 2019-11-28 ENCOUNTER — Telehealth: Payer: Self-pay

## 2019-11-28 NOTE — Telephone Encounter (Signed)
-----   Message from Ralene Bathe, MD sent at 11/27/2019  7:36 PM EDT ----- Dysplastic Mild If no appt, make pt an appt for 1 year Recheck next visit

## 2019-11-28 NOTE — Telephone Encounter (Signed)
LM on VM to return my call. 

## 2019-12-04 ENCOUNTER — Telehealth: Payer: Self-pay

## 2019-12-04 NOTE — Telephone Encounter (Signed)
DOS 12/07/2019  KELLER BUNION IMPLANT RT - [id number]  BCBS EFFECTIVE DATE - 03/23/2019  PLAN DEDUCTIBLE - $750.00 W/ $750.00 REMAINING OUT OF POCKET - $2500.00 W/ $1735.67 REMAINING COPAY $0.00 COINSURANCE - 10% PER SERVICE YEAR  NO AUTH REQUIRED PER WEBSITE

## 2019-12-04 NOTE — Telephone Encounter (Signed)
-----   Message from Ralene Bathe, MD sent at 11/27/2019  7:36 PM EDT ----- Dysplastic Mild If no appt, make pt an appt for 1 year Recheck next visit

## 2019-12-04 NOTE — Telephone Encounter (Signed)
Left message on voicemail to return my call.  

## 2019-12-06 ENCOUNTER — Other Ambulatory Visit: Payer: Self-pay | Admitting: Podiatry

## 2019-12-06 MED ORDER — ONDANSETRON HCL 4 MG PO TABS: 4.0000 mg | ORAL_TABLET | Freq: Three times a day (TID) | ORAL | 0 refills | Status: DC | PRN

## 2019-12-06 MED ORDER — CEPHALEXIN 500 MG PO CAPS: 500.0000 mg | ORAL_CAPSULE | Freq: Three times a day (TID) | ORAL | 0 refills | Status: DC

## 2019-12-06 MED ORDER — OXYCODONE-ACETAMINOPHEN 10-325 MG PO TABS: 1.0000 | ORAL_TABLET | Freq: Three times a day (TID) | ORAL | 0 refills | Status: AC | PRN

## 2019-12-07 DIAGNOSIS — K449 Diaphragmatic hernia without obstruction or gangrene: Secondary | ICD-10-CM | POA: Diagnosis not present

## 2019-12-07 DIAGNOSIS — M25571 Pain in right ankle and joints of right foot: Secondary | ICD-10-CM | POA: Diagnosis not present

## 2019-12-07 DIAGNOSIS — M205X1 Other deformities of toe(s) (acquired), right foot: Secondary | ICD-10-CM | POA: Diagnosis not present

## 2019-12-07 HISTORY — PX: OTHER SURGICAL HISTORY: SHX169

## 2019-12-12 ENCOUNTER — Encounter: Payer: BC Managed Care – PPO | Admitting: Podiatry

## 2019-12-12 DIAGNOSIS — M79676 Pain in unspecified toe(s): Secondary | ICD-10-CM

## 2019-12-14 ENCOUNTER — Encounter: Payer: Self-pay | Admitting: Podiatry

## 2019-12-14 ENCOUNTER — Ambulatory Visit (INDEPENDENT_AMBULATORY_CARE_PROVIDER_SITE_OTHER): Payer: BC Managed Care – PPO

## 2019-12-14 ENCOUNTER — Ambulatory Visit (INDEPENDENT_AMBULATORY_CARE_PROVIDER_SITE_OTHER): Payer: BC Managed Care – PPO | Admitting: Podiatry

## 2019-12-14 ENCOUNTER — Other Ambulatory Visit: Payer: Self-pay

## 2019-12-14 VITALS — BP 147/95 | HR 78 | Temp 98.6°F

## 2019-12-14 DIAGNOSIS — M205X1 Other deformities of toe(s) (acquired), right foot: Secondary | ICD-10-CM

## 2019-12-14 DIAGNOSIS — Z9889 Other specified postprocedural states: Secondary | ICD-10-CM

## 2019-12-14 NOTE — Progress Notes (Signed)
   Subjective:  Patient presents today status post Keller bunion implant right foot. DOS: 12/07/2019.  Patient states he is doing very well.  He has minimal pain.  He is kept the dressings clean dry and intact and has been wearing the cam boot as instructed.  No new complaints at this time  No past medical history on file.    Objective/Physical Exam Neurovascular status intact.  Skin incisions appear to be well coapted with sutures intact. No sign of infectious process noted. No dehiscence. No active bleeding noted. Moderate edema noted to the surgical extremity.  Radiographic Exam:  Joint implant and osteotomies sites appear to be stable with routine healing.  Assessment: 1. s/p Keller bunion implant right foot. DOS: 12/07/2019   Plan of Care:  1. Patient was evaluated. X-rays reviewed 2.  Dressings changed today.  Keep clean dry and intact x1 week 3.  Continue minimal weightbearing in the cam boot 4.  Return to clinic in 1 week  *Works in Aeronautical engineer for [employer]   Edrick Kins, DPM [facility]  Dr. Edrick Kins, [address]                                        [place], [place] [address]                Office [phone]  Fax [phone]

## 2019-12-17 ENCOUNTER — Telehealth: Payer: Self-pay

## 2019-12-17 ENCOUNTER — Other Ambulatory Visit: Payer: Self-pay | Admitting: Podiatry

## 2019-12-17 NOTE — Telephone Encounter (Signed)
-----   Message from Ralene Bathe, MD sent at 11/27/2019  7:36 PM EDT ----- Dysplastic Mild If no appt, make pt an appt for 1 year Recheck next visit

## 2019-12-17 NOTE — Progress Notes (Signed)
Letter sent to patient since we have tried to contact him three times without success.

## 2019-12-17 NOTE — Telephone Encounter (Signed)
Left message on voicemail to return my call.  

## 2019-12-19 ENCOUNTER — Other Ambulatory Visit: Payer: Self-pay

## 2019-12-19 ENCOUNTER — Encounter: Payer: Self-pay | Admitting: Podiatry

## 2019-12-19 ENCOUNTER — Ambulatory Visit (INDEPENDENT_AMBULATORY_CARE_PROVIDER_SITE_OTHER): Payer: BC Managed Care – PPO | Admitting: Podiatry

## 2019-12-19 DIAGNOSIS — Z9889 Other specified postprocedural states: Secondary | ICD-10-CM

## 2019-12-19 DIAGNOSIS — M205X1 Other deformities of toe(s) (acquired), right foot: Secondary | ICD-10-CM | POA: Diagnosis not present

## 2019-12-19 NOTE — Progress Notes (Signed)
He presents today second postop visit date of surgery 12/07/2019 status post Danny Fisher bunion implant states that is doing fine has not really bother me much at all.  He states that it feels much better than it did prior to surgery.  Objective: Vital signs are stable alert and oriented x3 there is no erythema edema cellulitis drainage or odor sutures were removed today margins remain well coapted he has great range of motion of dorsiflexion and plantarflexion encouraged him to continue range of motion exercises.  Assessment: Well-healing Keller arthroplasty single silicone implant right foot.  Plan: Placement a compression anklet and a Darco shoe I encouraged range of motion exercises and I would like to follow-up with him in 2 weeks

## 2019-12-27 ENCOUNTER — Other Ambulatory Visit: Payer: Self-pay | Admitting: Podiatry

## 2020-01-02 ENCOUNTER — Ambulatory Visit (INDEPENDENT_AMBULATORY_CARE_PROVIDER_SITE_OTHER): Payer: BC Managed Care – PPO | Admitting: Podiatry

## 2020-01-02 ENCOUNTER — Ambulatory Visit (INDEPENDENT_AMBULATORY_CARE_PROVIDER_SITE_OTHER): Payer: BC Managed Care – PPO

## 2020-01-02 ENCOUNTER — Encounter: Payer: Self-pay | Admitting: Podiatry

## 2020-01-02 ENCOUNTER — Other Ambulatory Visit: Payer: Self-pay

## 2020-01-02 DIAGNOSIS — Z9889 Other specified postprocedural states: Secondary | ICD-10-CM

## 2020-01-02 DIAGNOSIS — M205X1 Other deformities of toe(s) (acquired), right foot: Secondary | ICD-10-CM

## 2020-01-02 NOTE — Progress Notes (Signed)
He presents today for 1 month follow-up Keller bunion implant right.  Dates that its better only seems to hurt between my big toe my second toe slightly underneath.  He states that it clicks and pops some when he walks.  Objective: Vital signs are stable alert oriented x3.  Pulses are palpable.  There is no erythema edema cellulitis drainage or odor much decrease in the findings from previous evaluation.  Range of motion does demonstrate some clicking and popping it feels like is on the proximal side of the surgical site possibly a grommet that is moving when the implant moves.  Radiographs taken today do not demonstrate any type of problem with the implant the grommets appear to be in good position and tight.  Assessment: Well-healing surgical foot.  Plan: There is something definitely moving in the joint itself most of the time is to go on and fibrosis and quick clicking and popping however there is a slight grind this motion.  I would like another set of x-rays to be taken He comes in particularly the lateral view I would like dorsiflex as sharp as possible.  Because of this I feel that he probably should not go back to work yet I would like to give him another couple of weeks and I will reevaluate him at that time.

## 2020-01-03 ENCOUNTER — Other Ambulatory Visit: Payer: Self-pay | Admitting: Family Medicine

## 2020-01-03 DIAGNOSIS — F411 Generalized anxiety disorder: Secondary | ICD-10-CM

## 2020-01-04 ENCOUNTER — Other Ambulatory Visit: Payer: Self-pay | Admitting: Family Medicine

## 2020-01-04 DIAGNOSIS — Z9189 Other specified personal risk factors, not elsewhere classified: Secondary | ICD-10-CM

## 2020-01-07 ENCOUNTER — Encounter: Payer: Self-pay | Admitting: Podiatry

## 2020-01-08 ENCOUNTER — Encounter: Payer: Self-pay | Admitting: *Deleted

## 2020-01-08 ENCOUNTER — Other Ambulatory Visit: Payer: Self-pay | Admitting: Family Medicine

## 2020-01-08 DIAGNOSIS — F411 Generalized anxiety disorder: Secondary | ICD-10-CM

## 2020-01-08 NOTE — Telephone Encounter (Signed)
Requested Prescriptions  Pending Prescriptions Disp Refills  . DULoxetine (CYMBALTA) 60 MG capsule [Pharmacy Med Name: DULoxetine HCl 60 MG Oral Capsule Delayed Release Particles] 90 capsule 0    Sig: TAKE 1 CAPSULE BY MOUTH  DAILY     Psychiatry: Antidepressants - SNRI Failed - 01/08/2020  4:23 AM      Failed - Last BP in normal range    BP Readings from Last 1 Encounters:  12/14/19 (!) 147/95         Passed - Valid encounter within last 6 months    Recent Outpatient Visits          5 months ago Dyslipidemia (high LDL; low HDL)   [facility] [place], Drue Stager, MD   12 months ago Dyslipidemia (high LDL; low HDL)   [facility] Steele Sizer, MD   1 year ago GAD (generalized anxiety disorder)   [facility] Steele Sizer, MD   2 years ago GAD (generalized anxiety disorder)   [facility] [place], Drue Stager, MD   2 years ago Dyslipidemia (high LDL; low HDL)   [facility] Steele Sizer, MD      Future Appointments            In 3 weeks Steele Sizer, MD [facility] [place] on 01/03/2020 to not get transmitted through to pharmacy.  Re-submitting now.

## 2020-01-11 DIAGNOSIS — M79676 Pain in unspecified toe(s): Secondary | ICD-10-CM

## 2020-01-16 ENCOUNTER — Other Ambulatory Visit: Payer: Self-pay

## 2020-01-16 ENCOUNTER — Ambulatory Visit (INDEPENDENT_AMBULATORY_CARE_PROVIDER_SITE_OTHER): Payer: BC Managed Care – PPO | Admitting: Podiatry

## 2020-01-16 ENCOUNTER — Ambulatory Visit (INDEPENDENT_AMBULATORY_CARE_PROVIDER_SITE_OTHER): Payer: BC Managed Care – PPO

## 2020-01-16 ENCOUNTER — Encounter: Payer: Self-pay | Admitting: *Deleted

## 2020-01-16 ENCOUNTER — Encounter: Payer: Self-pay | Admitting: Podiatry

## 2020-01-16 DIAGNOSIS — M205X1 Other deformities of toe(s) (acquired), right foot: Secondary | ICD-10-CM | POA: Diagnosis not present

## 2020-01-16 DIAGNOSIS — Z9889 Other specified postprocedural states: Secondary | ICD-10-CM

## 2020-01-16 NOTE — Progress Notes (Signed)
He presents today postop visit #3 date of surgery 12/07/2019 Danny Fisher bunionectomy with implant right states that the clicking is better and is nearly stopped.  He says that have a good range of motion and the pain is nowhere near like it was before surgery and I like to consider getting back to work if possible.  Objective: Vital signs are stable alert and oriented x3.  There is no erythema edema cellulitis drainage or odor he does not have clicking on range of motion.  Radiographs taken today maximally dorsiflexed demonstrates probable slightly loose grommet of the first metatarsophalangeal joint implant on the metatarsal side.  My notes anything it looks torn malaligned or broken.  Assessment: Well-healing surgical foot.  Plan: Did discuss the possible need for removing the grommet if he continued to click or bother him.  However since it has been doing better I feel that most likely that will resolve.,  Thinks any residual pain including the plantar pain of his foot will resolve.  I would allow him to go back to work I did discourage him from high-impact activities for another month and a half or so.

## 2020-01-25 DIAGNOSIS — M79676 Pain in unspecified toe(s): Secondary | ICD-10-CM

## 2020-01-30 ENCOUNTER — Ambulatory Visit: Payer: BC Managed Care – PPO | Admitting: Family Medicine

## 2020-02-06 ENCOUNTER — Encounter: Payer: Self-pay | Admitting: Podiatry

## 2020-02-06 ENCOUNTER — Encounter: Payer: Self-pay | Admitting: Family Medicine

## 2020-02-06 ENCOUNTER — Other Ambulatory Visit: Payer: Self-pay | Admitting: Family Medicine

## 2020-02-06 DIAGNOSIS — E785 Hyperlipidemia, unspecified: Secondary | ICD-10-CM

## 2020-02-06 DIAGNOSIS — Z9189 Other specified personal risk factors, not elsewhere classified: Secondary | ICD-10-CM

## 2020-02-06 DIAGNOSIS — Z79899 Other long term (current) drug therapy: Secondary | ICD-10-CM

## 2020-02-06 DIAGNOSIS — R739 Hyperglycemia, unspecified: Secondary | ICD-10-CM

## 2020-02-06 DIAGNOSIS — R748 Abnormal levels of other serum enzymes: Secondary | ICD-10-CM

## 2020-02-27 ENCOUNTER — Ambulatory Visit (INDEPENDENT_AMBULATORY_CARE_PROVIDER_SITE_OTHER): Payer: BC Managed Care – PPO

## 2020-02-27 ENCOUNTER — Ambulatory Visit (INDEPENDENT_AMBULATORY_CARE_PROVIDER_SITE_OTHER): Payer: BC Managed Care – PPO | Admitting: Podiatry

## 2020-02-27 ENCOUNTER — Encounter: Payer: Self-pay | Admitting: Podiatry

## 2020-02-27 ENCOUNTER — Other Ambulatory Visit: Payer: Self-pay

## 2020-02-27 DIAGNOSIS — Z9889 Other specified postprocedural states: Secondary | ICD-10-CM

## 2020-02-27 DIAGNOSIS — M205X1 Other deformities of toe(s) (acquired), right foot: Secondary | ICD-10-CM | POA: Diagnosis not present

## 2020-02-27 MED ORDER — METHYLPREDNISOLONE 4 MG PO TBPK: ORAL_TABLET | ORAL | 0 refills | Status: DC

## 2020-02-27 NOTE — Progress Notes (Signed)
He presents today for another postop visit date of surgery is 12/07/2019 status post Danny Fisher implant first metatarsophalangeal joint right foot.  He states that it still really hurts as he refers to Monday he said that he had a pain shoot through his bottom of his foot and it took him to the ground.  He says he has not had another pain like that but he still has pain right and here as he points to an area beneath the second metatarsophalangeal joint.  Objective: Vital signs are stable alert oriented x3 pulses are palpable.  There is minimal edema no erythema cellulitis drainage or odor he does have tenderness on palpation and end range of motion of the second metatarsophalangeal joint which he relates is a similar sensation to what he has.  He has no pain on range of motion of the first metatarsophalangeal joint right with the exception of sharp dorsiflexion at end range of motion.  Radiographs taken today demonstrate no abnormality and no increase in swelling around the metatarsophalangeal joint first.  Second does demonstrate a slight bit of medial dislocation of the second toe.  Assessment: Well-healing first metatarsophalangeal joint Keller arthroplasty right.  Cannot rule out a capsulitis or even possible plantar plate tear.  Plan: Started him on methylprednisolone discussed appropriate shoe gear stretching exercise ice therapy on follow-up with him in 1 month.

## 2020-03-15 ENCOUNTER — Other Ambulatory Visit: Payer: Self-pay | Admitting: Podiatry

## 2020-03-26 DIAGNOSIS — R739 Hyperglycemia, unspecified: Secondary | ICD-10-CM | POA: Diagnosis not present

## 2020-03-26 DIAGNOSIS — Z79899 Other long term (current) drug therapy: Secondary | ICD-10-CM | POA: Diagnosis not present

## 2020-03-26 DIAGNOSIS — R748 Abnormal levels of other serum enzymes: Secondary | ICD-10-CM | POA: Diagnosis not present

## 2020-03-26 DIAGNOSIS — E785 Hyperlipidemia, unspecified: Secondary | ICD-10-CM | POA: Diagnosis not present

## 2020-03-27 LAB — HIV ANTIBODY (ROUTINE TESTING W REFLEX): HIV Screen 4th Generation wRfx: NONREACTIVE

## 2020-03-27 LAB — COMPREHENSIVE METABOLIC PANEL
ALT: 53 IU/L — ABNORMAL HIGH (ref 0–44)
AST: 35 IU/L (ref 0–40)
Albumin/Globulin Ratio: 2.3 — ABNORMAL HIGH (ref 1.2–2.2)
Albumin: 4.8 g/dL (ref 3.8–4.9)
Alkaline Phosphatase: 102 IU/L (ref 44–121)
BUN/Creatinine Ratio: 15 (ref 9–20)
BUN: 15 mg/dL (ref 6–24)
Bilirubin Total: 0.5 mg/dL (ref 0.0–1.2)
CO2: 24 mmol/L (ref 20–29)
Calcium: 9.4 mg/dL (ref 8.7–10.2)
Chloride: 103 mmol/L (ref 96–106)
Creatinine, Ser: 0.97 mg/dL (ref 0.76–1.27)
GFR calc Af Amer: 103 mL/min/{1.73_m2} (ref >59)
GFR calc non Af Amer: 89 mL/min/{1.73_m2} (ref >59)
Globulin, Total: 2.1 g/dL (ref 1.5–4.5)
Glucose: 104 mg/dL — ABNORMAL HIGH (ref 65–99)
Potassium: 4.7 mmol/L (ref 3.5–5.2)
Sodium: 143 mmol/L (ref 134–144)
Total Protein: 6.9 g/dL (ref 6.0–8.5)

## 2020-03-27 LAB — CBC WITH DIFFERENTIAL/PLATELET
Basophils Absolute: 0 10*3/uL (ref 0.0–0.2)
Basos: 0 %
EOS (ABSOLUTE): 0.1 10*3/uL (ref 0.0–0.4)
Eos: 2 %
Hematocrit: 50.1 % (ref 37.5–51.0)
Hemoglobin: 17.1 g/dL (ref 13.0–17.7)
Immature Grans (Abs): 0 10*3/uL (ref 0.0–0.1)
Immature Granulocytes: 0 %
Lymphocytes Absolute: 1.4 10*3/uL (ref 0.7–3.1)
Lymphs: 30 %
MCH: 29.9 pg (ref 26.6–33.0)
MCHC: 34.1 g/dL (ref 31.5–35.7)
MCV: 88 fL (ref 79–97)
Monocytes Absolute: 0.3 10*3/uL (ref 0.1–0.9)
Monocytes: 7 %
Neutrophils Absolute: 2.8 10*3/uL (ref 1.4–7.0)
Neutrophils: 61 %
Platelets: 192 10*3/uL (ref 150–450)
RBC: 5.71 x10E6/uL (ref 4.14–5.80)
RDW: 13.1 % (ref 11.6–15.4)
WBC: 4.5 10*3/uL (ref 3.4–10.8)

## 2020-03-27 LAB — NMR, LIPOPROFILE
Cholesterol, Total: 150 mg/dL (ref 100–199)
HDL Particle Number: 37.4 umol/L (ref >=30.5)
HDL-C: 52 mg/dL (ref >39)
LDL Particle Number: 1015 nmol/L — ABNORMAL HIGH (ref <1000)
LDL Size: 20.5 nm — ABNORMAL LOW (ref >20.5)
LDL-C (NIH Calc): 69 mg/dL (ref 0–99)
LP-IR Score: 69 — ABNORMAL HIGH (ref <=45)
Small LDL Particle Number: 584 nmol/L — ABNORMAL HIGH (ref <=527)
Triglycerides: 172 mg/dL — ABNORMAL HIGH (ref 0–149)

## 2020-03-27 LAB — HEPATITIS PANEL, ACUTE
Hep A IgM: NEGATIVE
Hep B C IgM: NEGATIVE
Hep C Virus Ab: <0.1 s/co ratio (ref 0.0–0.9)
Hepatitis B Surface Ag: NEGATIVE

## 2020-03-27 LAB — HEMOGLOBIN A1C
Est. average glucose Bld gHb Est-mCnc: 108 mg/dL
Hgb A1c MFr Bld: 5.4 % (ref 4.8–5.6)

## 2020-03-27 LAB — RPR: RPR Ser Ql: NONREACTIVE

## 2020-03-28 ENCOUNTER — Other Ambulatory Visit: Payer: Self-pay | Admitting: Family Medicine

## 2020-03-28 DIAGNOSIS — F411 Generalized anxiety disorder: Secondary | ICD-10-CM

## 2020-04-02 ENCOUNTER — Other Ambulatory Visit: Payer: Self-pay

## 2020-04-02 ENCOUNTER — Ambulatory Visit (INDEPENDENT_AMBULATORY_CARE_PROVIDER_SITE_OTHER): Payer: BC Managed Care – PPO | Admitting: Podiatry

## 2020-04-02 ENCOUNTER — Ambulatory Visit (INDEPENDENT_AMBULATORY_CARE_PROVIDER_SITE_OTHER): Payer: BC Managed Care – PPO

## 2020-04-02 ENCOUNTER — Encounter: Payer: Self-pay | Admitting: Podiatry

## 2020-04-02 DIAGNOSIS — Z9889 Other specified postprocedural states: Secondary | ICD-10-CM | POA: Diagnosis not present

## 2020-04-02 DIAGNOSIS — M205X1 Other deformities of toe(s) (acquired), right foot: Secondary | ICD-10-CM

## 2020-04-02 DIAGNOSIS — M778 Other enthesopathies, not elsewhere classified: Secondary | ICD-10-CM

## 2020-04-02 DIAGNOSIS — S99921A Unspecified injury of right foot, initial encounter: Secondary | ICD-10-CM

## 2020-04-02 NOTE — Progress Notes (Signed)
He presents today states that the surgical portion of the right foot is doing great but this is bothering the right here as he points to the plantar aspect of the second metatarsal phalangeal joint.  And states that when he puts weight on the foot he gets a predatory effect of the second toe medially dislocating with a valgus component.  Objective: Vital signs are stable he is alert and oriented x3 he has great range of motion of the first metatarsophalangeal joint which is going to heal completely with a arthroplasty and single implant.  However he does have pain on palpation with swelling of the plantar aspect of the second metatarsal phalangeal joint.  The pain is acute and there is medial dislocation of the toe with valgus can come component.  Assessment: Well-healing surgical foot with capsulitis and probable tear of the plantar capsule second toe at the metatarsal phalangeal joint.  Plan: At this point we will request an MRI to evaluate that joint.  If it is a tear then we will refer to Dr. Sherryle Lis for plantar plate repair.

## 2020-04-08 ENCOUNTER — Other Ambulatory Visit: Payer: Self-pay | Admitting: Family Medicine

## 2020-04-08 DIAGNOSIS — E785 Hyperlipidemia, unspecified: Secondary | ICD-10-CM

## 2020-04-08 MED ORDER — ROSUVASTATIN CALCIUM 20 MG PO TABS: 20.0000 mg | ORAL_TABLET | Freq: Every day | ORAL | 0 refills | Status: DC

## 2020-04-08 NOTE — Telephone Encounter (Signed)
Pt is scheduled °

## 2020-04-13 ENCOUNTER — Ambulatory Visit
Admission: RE | Admit: 2020-04-13 | Discharge: 2020-04-13 | Disposition: A | Discharge status: Home / Self Care | Payer: BC Managed Care – PPO | Source: Ambulatory Visit | Attending: Podiatry | Admitting: Podiatry

## 2020-04-13 ENCOUNTER — Other Ambulatory Visit: Payer: Self-pay

## 2020-04-13 DIAGNOSIS — S99921A Unspecified injury of right foot, initial encounter: Secondary | ICD-10-CM

## 2020-04-13 DIAGNOSIS — M778 Other enthesopathies, not elsewhere classified: Secondary | ICD-10-CM

## 2020-04-13 DIAGNOSIS — Z471 Aftercare following joint replacement surgery: Secondary | ICD-10-CM | POA: Diagnosis not present

## 2020-04-18 ENCOUNTER — Telehealth: Payer: Self-pay

## 2020-04-18 NOTE — Telephone Encounter (Signed)
-----   Message from Max T Hyatt, DPM sent at 04/16/2020  7:13 AM EST ----- Send out for an over read and have them look specifically at he second MTPJ.  Inform the patient of the delay and let him know the surgery looks fine but the second MTPJ was inconclusive so we are having another doctor take a look at the MRI> 

## 2020-04-18 NOTE — Telephone Encounter (Signed)
Faxed request for MRI disc and report from [facility].   Patient has been notified of delay

## 2020-04-29 ENCOUNTER — Ambulatory Visit: Payer: BC Managed Care – PPO | Admitting: Family Medicine

## 2020-05-02 ENCOUNTER — Telehealth: Payer: Self-pay

## 2020-05-02 NOTE — Telephone Encounter (Signed)
Mailed disc to [facility]

## 2020-05-02 NOTE — Telephone Encounter (Signed)
-----   Message from Garrel Ridgel, [place] sent at 04/16/2020  7:13 AM EST ----- Send out for an over read and have them look specifically at he second MTPJ.  Inform the patient of the delay and let him know the surgery looks fine but the second MTPJ was inconclusive so we are having another doctor take a look at the MRI>

## 2020-05-11 ENCOUNTER — Other Ambulatory Visit: Payer: Self-pay | Admitting: Family Medicine

## 2020-05-11 DIAGNOSIS — F411 Generalized anxiety disorder: Secondary | ICD-10-CM

## 2020-05-13 ENCOUNTER — Other Ambulatory Visit: Payer: Self-pay | Admitting: Family Medicine

## 2020-05-13 DIAGNOSIS — F411 Generalized anxiety disorder: Secondary | ICD-10-CM

## 2020-05-14 ENCOUNTER — Encounter: Payer: Self-pay | Admitting: Podiatry

## 2020-05-14 ENCOUNTER — Telehealth: Payer: Self-pay | Admitting: Family Medicine

## 2020-05-14 ENCOUNTER — Ambulatory Visit: Payer: BC Managed Care – PPO | Admitting: Podiatry

## 2020-05-14 ENCOUNTER — Other Ambulatory Visit: Payer: Self-pay

## 2020-05-14 DIAGNOSIS — F411 Generalized anxiety disorder: Secondary | ICD-10-CM

## 2020-05-14 DIAGNOSIS — M778 Other enthesopathies, not elsewhere classified: Secondary | ICD-10-CM

## 2020-05-14 NOTE — Progress Notes (Signed)
He presents today for follow-up of his surgical foot right we had an MRI done to evaluate the integrity of the 2nd and 3rd metatarsophalangeal joints.  He states that is the same and he continues to tape the toes together on a daily basis.  Objective: Vital signs are stable alert oriented x3.  Pulses are palpable.  There is no erythema edema cellulitis drainage or odor surgical portion of the foot 1st metatarsophalangeal joint placement looks great and is in good position he does have some scar tissue per MRI dorsally which is limiting the plantar flexion of the joint and has more than likely allowing for medial deviation of the toe.  MRI does not find any significant abnormalities at the 2nd or 3rd metatarsophalangeal joints.  He still has tenderness on palpation to that area.  Assessment: Negative MRI for capsulitis or plantar plate tear.  I think that the majority of his problem is the fact that the hallux is not approximating the ground [place] because of the scar tissue.  At this point I highly recommended physical therapy.  Plan: Recommended physical therapy to to bring the toe down so the 1st toe can the underlapping of the 2nd toe.

## 2020-05-14 NOTE — Telephone Encounter (Signed)
lvm for scheduling °

## 2020-05-14 NOTE — Telephone Encounter (Signed)
Requested medication (s) are due for refill today: yes  Requested medication (s) are on the active medication list: yes  Last refill:  04/27/2020  Future visit scheduled: no  Notes to clinic:  overdue for follow up appt [facility] refill has already been giving   Requested Prescriptions  Pending Prescriptions Disp Refills   DULoxetine (CYMBALTA) 60 MG capsule [Pharmacy Med Name: DULoxetine HCl 60 MG Oral Capsule Delayed Release Particles] 30 capsule 11    Sig: TAKE 1 CAPSULE BY MOUTH  DAILY      Psychiatry: Antidepressants - SNRI Failed - 05/14/2020 12:12 AM      Failed - Last BP in normal range    BP Readings from Last 1 Encounters:  12/14/19 (!) 147/95          Failed - Valid encounter within last 6 months    Recent Outpatient Visits           9 months ago Dyslipidemia (high LDL; low HDL)   [facility] Steele Sizer, MD   1 year ago Dyslipidemia (high LDL; low HDL)   [facility] Steele Sizer, MD   1 year ago GAD (generalized anxiety disorder)   [facility] Steele Sizer, MD   2 years ago GAD (generalized anxiety disorder)   [facility] [place], Drue Stager, MD   2 years ago Dyslipidemia (high LDL; low HDL)   [facility] Steele Sizer, MD

## 2020-06-16 ENCOUNTER — Other Ambulatory Visit: Payer: Self-pay | Admitting: Podiatry

## 2020-07-19 ENCOUNTER — Telehealth: Payer: Self-pay | Admitting: Family Medicine

## 2020-07-19 DIAGNOSIS — F411 Generalized anxiety disorder: Secondary | ICD-10-CM

## 2020-07-20 NOTE — Telephone Encounter (Signed)
Requested Prescriptions  Pending Prescriptions Disp Refills  . DULoxetine (CYMBALTA) 60 MG capsule [Pharmacy Med Name: DULoxetine HCl 60 MG Oral Capsule Delayed Release Particles] 10 capsule 0    Sig: TAKE 1 CAPSULE BY MOUTH  DAILY     Psychiatry: Antidepressants - SNRI Failed - 07/19/2020 10:18 PM      Failed - Last BP in normal range    BP Readings from Last 1 Encounters:  12/14/19 (!) 147/95         Failed - Valid encounter within last 6 months    Recent Outpatient Visits          11 months ago Dyslipidemia (high LDL; low HDL)   [facility] Steele Sizer, MD   1 year ago Dyslipidemia (high LDL; low HDL)   [facility] Steele Sizer, MD   2 years ago GAD (generalized anxiety disorder)   [facility] Steele Sizer, MD   2 years ago GAD (generalized anxiety disorder)   [facility] [place], Drue Stager, MD   2 years ago Dyslipidemia (high LDL; low HDL)   [facility] Steele Sizer, MD      Future Appointments            In 1 week Steele Sizer, MD [facility], [facility]

## 2020-07-20 NOTE — Telephone Encounter (Signed)
>   3 months overdue for appt. Pt given enough refill to last until upcoming appt (#10)

## 2020-07-21 ENCOUNTER — Other Ambulatory Visit: Payer: Self-pay | Admitting: Family Medicine

## 2020-07-21 DIAGNOSIS — F411 Generalized anxiety disorder: Secondary | ICD-10-CM

## 2020-07-21 MED ORDER — DULOXETINE HCL 60 MG PO CPEP: 60.0000 mg | ORAL_CAPSULE | Freq: Every day | ORAL | 0 refills | Status: DC

## 2020-07-21 NOTE — Telephone Encounter (Signed)
Pt has an appt for 07/29/20

## 2020-07-28 NOTE — Progress Notes (Signed)
Name: Danny Fisher   MRN: [id number]    DOB: 06/20/1965   Date:07/29/2020       Progress Note  Subjective  Chief Complaint  Follow Up  HPI  Dyslipidemia:he denies significant history of heart disease , but mother has dyslipidemia and takes cholesterol medication and grandmother had a stroke in her 64's. He denies chest pain but has noticed occasional  Palpitation is very seldom now, about once a month. . He has been out of Crestor for about one month, we will increase dose to 40 mg and recheck labs next visit    AR: symptoms are worse in the Spring, he was coughing for weeks but stopped now, he is not taking singulair , states only on nasal spray and zyxal and needs a refill. No wheezing or SOB. No rhinorrhea or nasal congestion   GAD: he states that he was diagnosed with ADD , he is doing great on Duloxetine. . No longer has problems falling asleep. He does not worry constantly. He still picks on his nails, but not as much. He still vapes but less lately.   Homosexual: married, but open relationship, on Truvada and denies side effects. We will recheck HIV today. He wants to wait until cpe to check for [facility]   Patient Active Problem List   Diagnosis Date Noted  . Dyslipidemia 11/22/2017  . Tympanic membrane rupture, left 11/22/2017  . Obesity (BMI 30.0-34.9) 11/22/2017  . High risk homosexual behavior 11/22/2017  . ADD (attention deficit disorder) 11/22/2017    Past Surgical History:  Procedure Laterality Date  .  implant first metatarsophalangeal joint right foot Right 12/07/2019   Dr. Milinda Pointer   . INCISIONAL HERNIA REPAIR  1971    Family History  Problem Relation Age of Onset  . Hyperlipidemia Mother   . Ovarian cancer Maternal Grandmother   . Emphysema Maternal Grandfather        Tobacco User  . Stroke Paternal Grandmother     Social History   Tobacco Use  . Smoking status: Former Smoker    Packs/day: 0.50    Years: 25.00    Pack years: 12.50    Types: Cigarettes     Start date: 03/22/1984    Quit date: 11/22/2009    Years since quitting: 10.6  . Smokeless tobacco: Never Used  Substance Use Topics  . Alcohol use: Yes    Comment: very seldom      Current Outpatient Medications:  .  aspirin EC 81 MG tablet, Take 1 tablet (81 mg total) by mouth daily., Disp: 30 tablet, Rfl: 0 .  azelastine (ASTELIN) 0.1 % nasal spray, Place 1 spray into both nostrils 2 (two) times daily., Disp: 90 mL, Rfl: 1 .  diphenhydrAMINE (BENADRYL ALLERGY) 25 MG tablet, Take 1 tablet (25 mg total) by mouth every 6 (six) hours as needed., Disp: 30 tablet, Rfl: 0 .  DULoxetine (CYMBALTA) 60 MG capsule, Take 1 capsule (60 mg total) by mouth daily., Disp: 30 capsule, Rfl: 0 .  emtricitabine-tenofovir (TRUVADA) 200-300 MG tablet, TAKE 1 TABLET BY MOUTH  DAILY, Disp: 90 tablet, Rfl: 2 .  levocetirizine (XYZAL) 5 MG tablet, TAKE 1 TABLET BY MOUTH IN  THE EVENING, Disp: 90 tablet, Rfl: 1 .  Multiple Vitamins-Minerals (MULTIVITAMIN WITH MINERALS) tablet, Take 1 tablet by mouth daily., Disp: 30 tablet, Rfl: 0 .  rosuvastatin (CRESTOR) 20 MG tablet, Take 1 tablet (20 mg total) by mouth daily., Disp: 30 tablet, Rfl: 0  No Known Allergies  I personally  reviewed active problem list, medication list, allergies, family history, social history, health maintenance, notes from last encounter with the patient/caregiver today.   ROS  Constitutional: Negative for fever or weight change.  Respiratory: Negative for cough and shortness of breath.   Cardiovascular: Negative for chest pain or palpitations.  Gastrointestinal: Negative for abdominal pain, no bowel changes.  Musculoskeletal: Negative for gait problem or joint swelling.  Skin: Negative for rash.  Neurological: Negative for dizziness or headache.  No other specific complaints in a complete review of systems (except as listed in HPI above).   Objective  Vitals:   07/29/20 1024 07/29/20 1037  BP: (!) 142/86 140/86  Pulse: 98   Resp: 18    Temp: 98.4 F (36.9 C)   SpO2: 99%   Weight: 224 lb 4.8 oz (101.7 kg)   Height: 5\' 10"  (1.778 m)     Body mass index is 32.18 kg/m.  Physical Exam  Constitutional: Patient appears well-developed and well-nourished. Obese  No distress.  HEENT: head atraumatic, normocephalic, pupils equal and reactive to light,  neck supple Cardiovascular: Normal rate, regular rhythm and normal heart sounds.  No murmur heard. No BLE edema. Pulmonary/Chest: Effort normal and breath sounds normal. No respiratory distress. Abdominal: Soft.  There is no tenderness. Psychiatric: Patient has a normal mood and affect. behavior is normal. Judgment and thought content normal.  PHQ2/9: Depression screen [facility] 2/9 07/29/2020 07/30/2019 01/12/2019 06/27/2018 12/23/2017  Decreased Interest 0 0 0 0 0  Down, Depressed, Hopeless 0 0 0 0 0  PHQ - 2 Score 0 0 0 0 0  Altered sleeping 0 0 0 0 0  Tired, decreased energy 0 0 1 1 0  Change in appetite 0 0 0 0 0  Feeling bad or failure about yourself  0 0 0 0 0  Trouble concentrating 0 0 0 0 0  Moving slowly or fidgety/restless 0 0 0 0 0  Suicidal thoughts 0 0 0 0 0  PHQ-9 Score 0 0 1 1 0  Difficult doing work/chores Not difficult at all - - Not difficult at all Not difficult at all    phq 9 is negative   Fall Risk: Fall Risk  07/29/2020 07/30/2019 01/12/2019 06/27/2018 12/23/2017  Falls in the past year? 0 0 0 0 No  Number falls in past yr: 0 0 0 0 -  Injury with Fall? 0 0 0 0 -    Functional Status Survey: Is the patient deaf or have difficulty hearing?: No Does the patient have difficulty seeing, even when wearing glasses/contacts?: No Does the patient have difficulty concentrating, remembering, or making decisions?: No Does the patient have difficulty walking or climbing stairs?: No Does the patient have difficulty dressing or bathing?: No Does the patient have difficulty doing errands alone such as visiting a doctor's office or shopping?: No    Assessment &  Plan  1. Dyslipidemia (high LDL; low HDL)  - rosuvastatin (CRESTOR) 40 MG tablet; Take 1 tablet (40 mg total) by mouth daily.  Dispense: 90 tablet; Refill: 1  2. At risk for HIV due to homosexual contact  - emtricitabine-tenofovir (TRUVADA) 200-300 MG tablet; Take 1 tablet by mouth daily.  Dispense: 90 tablet; Refill: 3  3. GAD (generalized anxiety disorder)  - DULoxetine (CYMBALTA) 60 MG capsule; Take 1 capsule (60 mg total) by mouth daily.  Dispense: 90 capsule; Refill: 1  4. Perennial allergic rhinitis with seasonal variation  - azelastine (ASTELIN) 0.1 % nasal spray; Place 1 spray into  both nostrils 2 (two) times daily.  Dispense: 90 mL; Refill: 1 - levocetirizine (XYZAL) 5 MG tablet; Take 1 tablet (5 mg total) by mouth every evening.  Dispense: 90 tablet; Refill: 1

## 2020-07-29 ENCOUNTER — Other Ambulatory Visit: Payer: Self-pay

## 2020-07-29 ENCOUNTER — Encounter: Payer: Self-pay | Admitting: Family Medicine

## 2020-07-29 ENCOUNTER — Ambulatory Visit (INDEPENDENT_AMBULATORY_CARE_PROVIDER_SITE_OTHER): Payer: BC Managed Care – PPO | Admitting: Family Medicine

## 2020-07-29 VITALS — BP 140/86 | HR 98 | Temp 98.4°F | Resp 18 | Ht 70.0 in | Wt 224.3 lb

## 2020-07-29 DIAGNOSIS — E785 Hyperlipidemia, unspecified: Secondary | ICD-10-CM | POA: Diagnosis not present

## 2020-07-29 DIAGNOSIS — J302 Other seasonal allergic rhinitis: Secondary | ICD-10-CM

## 2020-07-29 DIAGNOSIS — Z9189 Other specified personal risk factors, not elsewhere classified: Secondary | ICD-10-CM | POA: Diagnosis not present

## 2020-07-29 DIAGNOSIS — J3089 Other allergic rhinitis: Secondary | ICD-10-CM

## 2020-07-29 DIAGNOSIS — F411 Generalized anxiety disorder: Secondary | ICD-10-CM | POA: Diagnosis not present

## 2020-07-29 MED ORDER — ROSUVASTATIN CALCIUM 40 MG PO TABS
40.0000 mg | ORAL_TABLET | Freq: Every day | ORAL | 1 refills | Status: DC
Start: 2020-07-29 — End: 2020-12-05

## 2020-07-29 MED ORDER — LEVOCETIRIZINE DIHYDROCHLORIDE 5 MG PO TABS: 5.0000 mg | ORAL_TABLET | Freq: Every evening | ORAL | 1 refills | Status: DC

## 2020-07-29 MED ORDER — EMTRICITABINE-TENOFOVIR DF 200-300 MG PO TABS: 1.0000 | ORAL_TABLET | Freq: Every day | ORAL | 3 refills | Status: DC

## 2020-07-29 MED ORDER — DULOXETINE HCL 60 MG PO CPEP: 60.0000 mg | ORAL_CAPSULE | Freq: Every day | ORAL | 1 refills | Status: DC

## 2020-07-29 MED ORDER — AZELASTINE HCL 0.1 % NA SOLN: 1.0000 | Freq: Two times a day (BID) | NASAL | 1 refills | Status: DC

## 2020-09-11 ENCOUNTER — Other Ambulatory Visit: Payer: Self-pay | Admitting: Family Medicine

## 2020-10-28 ENCOUNTER — Other Ambulatory Visit: Payer: Self-pay | Admitting: Family Medicine

## 2020-10-28 DIAGNOSIS — E785 Hyperlipidemia, unspecified: Secondary | ICD-10-CM

## 2020-12-05 ENCOUNTER — Other Ambulatory Visit: Payer: Self-pay

## 2020-12-05 ENCOUNTER — Ambulatory Visit (INDEPENDENT_AMBULATORY_CARE_PROVIDER_SITE_OTHER): Payer: BC Managed Care – PPO | Admitting: Family Medicine

## 2020-12-05 ENCOUNTER — Encounter: Payer: Self-pay | Admitting: Family Medicine

## 2020-12-05 ENCOUNTER — Other Ambulatory Visit: Payer: Self-pay | Admitting: Family Medicine

## 2020-12-05 VITALS — BP 140/82 | HR 87 | Temp 98.4°F | Resp 16 | Ht 70.0 in | Wt 223.0 lb

## 2020-12-05 DIAGNOSIS — Z79899 Other long term (current) drug therapy: Secondary | ICD-10-CM

## 2020-12-05 DIAGNOSIS — Z9189 Other specified personal risk factors, not elsewhere classified: Secondary | ICD-10-CM

## 2020-12-05 DIAGNOSIS — E785 Hyperlipidemia, unspecified: Secondary | ICD-10-CM

## 2020-12-05 DIAGNOSIS — J3089 Other allergic rhinitis: Secondary | ICD-10-CM

## 2020-12-05 DIAGNOSIS — F411 Generalized anxiety disorder: Secondary | ICD-10-CM

## 2020-12-05 DIAGNOSIS — IMO0001 Reserved for inherently not codable concepts without codable children: Secondary | ICD-10-CM

## 2020-12-05 DIAGNOSIS — Z0000 Encounter for general adult medical examination without abnormal findings: Secondary | ICD-10-CM | POA: Diagnosis not present

## 2020-12-05 DIAGNOSIS — R739 Hyperglycemia, unspecified: Secondary | ICD-10-CM | POA: Diagnosis not present

## 2020-12-05 DIAGNOSIS — Z23 Encounter for immunization: Secondary | ICD-10-CM | POA: Diagnosis not present

## 2020-12-05 DIAGNOSIS — I10 Essential (primary) hypertension: Secondary | ICD-10-CM

## 2020-12-05 DIAGNOSIS — Z1211 Encounter for screening for malignant neoplasm of colon: Secondary | ICD-10-CM

## 2020-12-05 DIAGNOSIS — J302 Other seasonal allergic rhinitis: Secondary | ICD-10-CM

## 2020-12-05 MED ORDER — LOSARTAN POTASSIUM 50 MG PO TABS
50.0000 mg | ORAL_TABLET | Freq: Every day | ORAL | 0 refills | Status: DC
Start: 2020-12-05 — End: 2021-02-05

## 2020-12-05 MED ORDER — ROSUVASTATIN CALCIUM 40 MG PO TABS: 40.0000 mg | ORAL_TABLET | Freq: Every day | ORAL | 1 refills | Status: DC

## 2020-12-05 MED ORDER — LEVOCETIRIZINE DIHYDROCHLORIDE 5 MG PO TABS: 5.0000 mg | ORAL_TABLET | Freq: Every evening | ORAL | 1 refills | Status: DC

## 2020-12-05 MED ORDER — DULOXETINE HCL 60 MG PO CPEP: 60.0000 mg | ORAL_CAPSULE | Freq: Every day | ORAL | 1 refills | Status: DC

## 2020-12-05 MED ORDER — AZELASTINE HCL 0.1 % NA SOLN: 1.0000 | Freq: Two times a day (BID) | NASAL | 1 refills | Status: DC

## 2020-12-05 NOTE — Progress Notes (Deleted)
Name: Danny Fisher   MRN: [id number]    DOB: 1966-02-08   Date:12/05/2020       Progress Note  Subjective  Chief Complaint  Annual Exam  HPI  Patient presents for annual CPE ***.  IPSS Questionnaire (AUA-7): Over the past month.   1)  How often have you had a sensation of not emptying your bladder completely after you finish urinating?  0 - Not at all  2)  How often have you had to urinate again less than two hours after you finished urinating? 0 - Not at all  3)  How often have you found you stopped and started again several times when you urinated?  0 - Not at all  4) How difficult have you found it to postpone urination?  0 - Not at all  5) How often have you had a weak urinary stream?  0 - Not at all  6) How often have you had to push or strain to begin urination?  0 - Not at all  7) How many times did you most typically get up to urinate from the time you went to bed until the time you got up in the morning?  1 - 1 time  Total score:  0-7 mildly symptomatic   8-19 moderately symptomatic   20-35 severely symptomatic     Diet: *** Exercise: ***  Depression: phq 9 is negative Depression screen [facility] 2/9 12/05/2020 07/29/2020 07/30/2019 01/12/2019 06/27/2018  Decreased Interest 0 0 0 0 0  Down, Depressed, Hopeless 0 0 0 0 0  PHQ - 2 Score 0 0 0 0 0  Altered sleeping - 0 0 0 0  Tired, decreased energy - 0 0 1 1  Change in appetite - 0 0 0 0  Feeling bad or failure about yourself  - 0 0 0 0  Trouble concentrating - 0 0 0 0  Moving slowly or fidgety/restless - 0 0 0 0  Suicidal thoughts - 0 0 0 0  PHQ-9 Score - 0 0 1 1  Difficult doing work/chores - Not difficult at all - - Not difficult at all    Hypertension:  BP Readings from Last 3 Encounters:  12/05/20 140/82  07/29/20 140/86  12/14/19 (!) 147/95    Obesity: Wt Readings from Last 3 Encounters:  12/05/20 223 lb (101.2 kg)  07/29/20 224 lb 4.8 oz (101.7 kg)  07/30/19 212 lb 9.6 oz (96.4 kg)   BMI Readings from Last 3  Encounters:  12/05/20 32.00 kg/m  07/29/20 32.18 kg/m  07/30/19 30.95 kg/m     Lipids:  Lab Results  Component Value Date   CHOL 315 (A) 10/22/2017   Lab Results  Component Value Date   HDL 42 10/22/2017   Lab Results  Component Value Date   LDLCALC 228 10/22/2017   Lab Results  Component Value Date   TRIG 223 (A) 10/22/2017   No results found for: CHOLHDL No results found for: LDLDIRECT Glucose:  Glucose  Date Value Ref Range Status  03/26/2020 104 (H) 65 - 99 mg/dL Final  06/21/2018 102 (H) 65 - 99 mg/dL Final  12/16/2017 95 65 - 99 mg/dL Final    Flowsheet Row Orders Only from 12/05/2020 in [facility]  AUDIT-C Score 0      Married STD testing and prevention (HIV/chl/gon/syphilis): 03/26/20 Hep C: 03/26/20  Skin cancer: Discussed monitoring for atypical lesions Colorectal cancer: 12/13/17 Prostate cancer:  No results found for: PSA   Lung  cancer: Low Dose CT Chest recommended if Age 71-80 years, 30 pack-year currently smoking OR have quit w/in 15years. Patient does not qualify.   AAA:  The USPSTF recommends one-time screening with ultrasonography in men ages 73 to 50 years who have ever smoked ECG:  11/22/17  Vaccines:  HPV: up to at age 46 , ask insurance if age between 28-45  Shingrix: 32-64 yo and ask insurance if covered when patient above 50 yo Pneumonia: educated and discussed with patient. Flu: educated and discussed with patient.  Advanced Care Planning: A voluntary discussion about advance care planning including the explanation and discussion of advance directives.  Discussed health care proxy and Living will, and the patient was able to identify a health care proxy as ***.  Patient does not have a living will at present time. If patient does have living will, I have requested they bring this to the clinic to be scanned in to their chart.  Patient Active Problem List   Diagnosis Date Noted   Dyslipidemia 11/22/2017   Tympanic  membrane rupture, left 11/22/2017   Obesity (BMI 30.0-34.9) 11/22/2017   High risk homosexual behavior 11/22/2017   ADD (attention deficit disorder) 11/22/2017    Past Surgical History:  Procedure Laterality Date    implant first metatarsophalangeal joint right foot Right 12/07/2019   Dr. Doreatha Lew HERNIA REPAIR  1971    Family History  Problem Relation Age of Onset   Hyperlipidemia Mother    Ovarian cancer Maternal Grandmother    Emphysema Maternal Grandfather        Tobacco User   Stroke Paternal Grandmother     Social History   Socioeconomic History   Marital status: Married    Spouse name: [name]   Number of children: 0   Years of education: Not on file   Highest education level: Bachelor's degree (e.g., BA, AB, BS)  Occupational History   Occupation: Optician, dispensing   Tobacco Use   Smoking status: Former    Packs/day: 0.50    Years: 25.00    Pack years: 12.50    Types: Cigarettes    Start date: 03/22/1984    Quit date: 11/22/2009    Years since quitting: 11.0   Smokeless tobacco: Never  Vaping Use   Vaping Use: Every day   Start date: 11/23/2011   Substances: Nicotine, Flavoring  Substance and Sexual Activity   Alcohol use: Yes    Comment: very seldom    Drug use: Yes    Types: Marijuana    Comment: usually at night before sleep   Sexual activity: Yes    Partners: Male  Other Topics Concern   Not on file  Social History Narrative   He is married to [place], he has working for [employer] for since 2009    Social Determinants of Radio broadcast assistant Strain: Low Risk    Difficulty of Paying Living Expenses: Not hard at all  Food Insecurity: No Food Insecurity   Worried About Charity fundraiser in the Last Year: Never true   Arboriculturist in the Last Year: Never true  Transportation Needs: No Transportation Needs   Lack of Transportation (Medical): No   Lack of Transportation (Non-Medical): No  Physical Activity: Insufficiently Active    Days of Exercise per Week: 1 day   Minutes of Exercise per Session: 40 min  Stress: No Stress Concern Present   Feeling of Stress : Not at all  Social Connections: Moderately Isolated   Frequency of Communication with Friends and Family: More than three times a week   Frequency of Social Gatherings with Friends and Family: Once a week   Attends Religious Services: Never   Marine scientist or Organizations: No   Attends Music therapist: Never   Marital Status: Married  Human resources officer Violence: Not At Risk   Fear of Current or Ex-Partner: No   Emotionally Abused: No   Physically Abused: No   Sexually Abused: No     Current Outpatient Medications:    diphenhydrAMINE (BENADRYL ALLERGY) 25 MG tablet, Take 1 tablet (25 mg total) by mouth every 6 (six) hours as needed., Disp: 30 tablet, Rfl: 0   emtricitabine-tenofovir (TRUVADA) 200-300 MG tablet, Take 1 tablet by mouth daily., Disp: 90 tablet, Rfl: 3   Multiple Vitamins-Minerals (MULTIVITAMIN WITH MINERALS) tablet, Take 1 tablet by mouth daily., Disp: 30 tablet, Rfl: 0   azelastine (ASTELIN) 0.1 % nasal spray, Place 1 spray into both nostrils 2 (two) times daily., Disp: 90 mL, Rfl: 1   DULoxetine (CYMBALTA) 60 MG capsule, Take 1 capsule (60 mg total) by mouth daily., Disp: 90 capsule, Rfl: 1   levocetirizine (XYZAL) 5 MG tablet, Take 1 tablet (5 mg total) by mouth every evening., Disp: 90 tablet, Rfl: 1   losartan (COZAAR) 50 MG tablet, Take 1 tablet (50 mg total) by mouth daily., Disp: 90 tablet, Rfl: 0   rosuvastatin (CRESTOR) 40 MG tablet, Take 1 tablet (40 mg total) by mouth daily., Disp: 90 tablet, Rfl: 1  No Known Allergies   ROS  ***   Objective  Vitals:   12/05/20 1513  BP: 140/82  Pulse: 87  Resp: 16  Temp: 98.4 F (36.9 C)  Weight: 223 lb (101.2 kg)  Height: '5\' 10"'$  (1.778 m)    Body mass index is 32 kg/m.  Physical Exam ***  No results found for this or any previous visit (from the  past 2160 hour(s)).   Fall Risk: Fall Risk  12/05/2020 07/29/2020 07/30/2019 01/12/2019 06/27/2018  Falls in the past year? 0 0 0 0 0  Number falls in past yr: 0 0 0 0 0  Injury with Fall? 0 0 0 0 0  Risk for fall due to : No Fall Risks - - - -  Follow up Falls prevention discussed - - - -      Functional Status Survey: Is the patient deaf or have difficulty hearing?: No Does the patient have difficulty seeing, even when wearing glasses/contacts?: No Does the patient have difficulty concentrating, remembering, or making decisions?: No Does the patient have difficulty walking or climbing stairs?: No Does the patient have difficulty dressing or bathing?: No Does the patient have difficulty doing errands alone such as visiting a doctor's office or shopping?: No    Assessment & Plan  There are no diagnoses linked to this encounter.   -Prostate cancer screening and PSA options (with potential risks and benefits of testing vs not testing) were discussed along with recent recs/guidelines. -USPSTF grade A and B recommendations reviewed with patient; age-appropriate recommendations, preventive care, screening tests, etc discussed and encouraged; healthy living encouraged; see AVS for patient education given to patient -Discussed importance of 150 minutes of physical activity weekly, eat two servings of fish weekly, eat one serving of tree nuts ( cashews, pistachios, pecans, almonds.Marland Kitchen) every other day, eat 6 servings of fruit/vegetables daily and drink plenty of water and avoid sweet beverages.  -  Reviewed Health Maintenance: ***

## 2020-12-05 NOTE — Progress Notes (Unsigned)
Name: Danny Fisher   MRN: [id number]    DOB: October 14, 1965   Date:12/05/2020       Progress Note  Subjective  Chief Complaint  No chief complaint on file.   HPI  Patient presents for annual CPE ***.  IPSS Questionnaire (AUA-7): Over the past month.   1)  How often have you had a sensation of not emptying your bladder completely after you finish urinating?  {Rating:19227}  2)  How often have you had to urinate again less than two hours after you finished urinating? {Rating:19227}  3)  How often have you found you stopped and started again several times when you urinated?  {Rating:19227}  4) How difficult have you found it to postpone urination?  {Rating:19227}  5) How often have you had a weak urinary stream?  {Rating:19227}  6) How often have you had to push or strain to begin urination?  {Rating:19227}  7) How many times did you most typically get up to urinate from the time you went to bed until the time you got up in the morning?  {Rating:19228}  Total score:  0-7 mildly symptomatic   8-19 moderately symptomatic   20-35 severely symptomatic     Diet: *** Exercise: ***  Depression: phq 9 is {gen pos [id number] Depression screen [facility] 2/9 12/05/2020 07/29/2020 07/30/2019 01/12/2019 06/27/2018  Decreased Interest 0 0 0 0 0  Down, Depressed, Hopeless 0 0 0 0 0  PHQ - 2 Score 0 0 0 0 0  Altered sleeping - 0 0 0 0  Tired, decreased energy - 0 0 1 1  Change in appetite - 0 0 0 0  Feeling bad or failure about yourself  - 0 0 0 0  Trouble concentrating - 0 0 0 0  Moving slowly or fidgety/restless - 0 0 0 0  Suicidal thoughts - 0 0 0 0  PHQ-9 Score - 0 0 1 1  Difficult doing work/chores - Not difficult at all - - Not difficult at all    Hypertension:  BP Readings from Last 3 Encounters:  12/05/20 140/82  07/29/20 140/86  12/14/19 (!) 147/95    Obesity: Wt Readings from Last 3 Encounters:  12/05/20 223 lb (101.2 kg)  07/29/20 224 lb 4.8 oz (101.7 kg)  07/30/19 212 lb 9.6 oz (96.4  kg)   BMI Readings from Last 3 Encounters:  12/05/20 32.00 kg/m  07/29/20 32.18 kg/m  07/30/19 30.95 kg/m     Lipids:  Lab Results  Component Value Date   CHOL 315 (A) 10/22/2017   Lab Results  Component Value Date   HDL 42 10/22/2017   Lab Results  Component Value Date   LDLCALC 228 10/22/2017   Lab Results  Component Value Date   TRIG 223 (A) 10/22/2017   No results found for: CHOLHDL No results found for: LDLDIRECT Glucose:  Glucose  Date Value Ref Range Status  03/26/2020 104 (H) 65 - 99 mg/dL Final  06/21/2018 102 (H) 65 - 99 mg/dL Final  12/16/2017 95 65 - 99 mg/dL Final    Flowsheet Row Orders Only from 12/05/2020 in [facility]  AUDIT-C Score 0       Married STD testing and prevention (HIV/chl/gon/syphilis): today  Hep C: up to date   Skin cancer: Discussed monitoring for atypical lesions Colorectal cancer: time to repeat Cologuard  Prostate cancer: discussed USPTF     Lung cancer:   Low Dose CT Chest recommended if Age 57-80 years, 30 pack-year  currently smoking OR have quit w/in 15years. Patient does not qualify.   AAA:  The USPSTF recommends one-time screening with ultrasonography in men ages 44 to 4 years who have ever smoked ECG:  2019  Vaccines:   Shingrix: 23-64 yo and ask insurance if covered when patient above 89 yo Pneumonia:  educated and discussed with patient. Flu:  educated and discussed with patient.  Advanced Care Planning: A voluntary discussion about advance care planning including the explanation and discussion of advance directives.  Discussed health care proxy and Living will, and the patient was able to identify a health care proxy as Danny Fisher - husband .  Patient does not have a living will at present time.   Patient Active Problem List   Diagnosis Date Noted   Dyslipidemia 11/22/2017   Tympanic membrane rupture, left 11/22/2017   Obesity (BMI 30.0-34.9) 11/22/2017   High risk homosexual behavior  11/22/2017   ADD (attention deficit disorder) 11/22/2017    Past Surgical History:  Procedure Laterality Date    implant first metatarsophalangeal joint right foot Right 12/07/2019   Dr. Doreatha Lew HERNIA REPAIR  1971    Family History  Problem Relation Age of Onset   Hyperlipidemia Mother    Ovarian cancer Maternal Grandmother    Emphysema Maternal Grandfather        Tobacco User   Stroke Paternal Grandmother     Social History   Socioeconomic History   Marital status: Married    Spouse name: Danny Fisher   Number of children: 0   Years of education: Not on file   Highest education level: Bachelor's degree (e.g., BA, AB, BS)  Occupational History   Occupation: Optician, dispensing   Tobacco Use   Smoking status: Former    Packs/day: 0.50    Years: 25.00    Pack years: 12.50    Types: Cigarettes    Start date: 03/22/1984    Quit date: 11/22/2009    Years since quitting: 11.0   Smokeless tobacco: Never  Vaping Use   Vaping Use: Every day   Start date: 11/23/2011   Substances: Nicotine, Flavoring  Substance and Sexual Activity   Alcohol use: Yes    Comment: very seldom    Drug use: Yes    Types: Marijuana    Comment: usually at night before sleep   Sexual activity: Yes    Partners: Male  Other Topics Concern   Not on file  Social History Narrative   He is married to Danny Fisher, he has working for [employer] for since 2009    Social Determinants of Radio broadcast assistant Strain: Low Risk    Difficulty of Paying Living Expenses: Not hard at all  Food Insecurity: No Food Insecurity   Worried About Charity fundraiser in the Last Year: Never true   Arboriculturist in the Last Year: Never true  Transportation Needs: No Transportation Needs   Lack of Transportation (Medical): No   Lack of Transportation (Non-Medical): No  Physical Activity: Insufficiently Active   Days of Exercise per Week: 1 day   Minutes of Exercise per Session: 40 min  Stress: No Stress  Concern Present   Feeling of Stress : Not at all  Social Connections: Moderately Isolated   Frequency of Communication with Friends and Family: More than three times a week   Frequency of Social Gatherings with Friends and Family: Once a week   Attends Religious Services: Never   Active  Member of Clubs or Organizations: No   Attends Music therapist: Never   Marital Status: Married  Human resources officer Violence: Not At Risk   Fear of Current or Ex-Partner: No   Emotionally Abused: No   Physically Abused: No   Sexually Abused: No     Current Outpatient Medications:    losartan (COZAAR) 50 MG tablet, Take 1 tablet (50 mg total) by mouth daily., Disp: 90 tablet, Rfl: 0   azelastine (ASTELIN) 0.1 % nasal spray, Place 1 spray into both nostrils 2 (two) times daily., Disp: 90 mL, Rfl: 1   diphenhydrAMINE (BENADRYL ALLERGY) 25 MG tablet, Take 1 tablet (25 mg total) by mouth every 6 (six) hours as needed., Disp: 30 tablet, Rfl: 0   DULoxetine (CYMBALTA) 60 MG capsule, Take 1 capsule (60 mg total) by mouth daily., Disp: 90 capsule, Rfl: 1   emtricitabine-tenofovir (TRUVADA) 200-300 MG tablet, Take 1 tablet by mouth daily., Disp: 90 tablet, Rfl: 3   levocetirizine (XYZAL) 5 MG tablet, Take 1 tablet (5 mg total) by mouth every evening., Disp: 90 tablet, Rfl: 1   Multiple Vitamins-Minerals (MULTIVITAMIN WITH MINERALS) tablet, Take 1 tablet by mouth daily., Disp: 30 tablet, Rfl: 0   rosuvastatin (CRESTOR) 40 MG tablet, Take 1 tablet (40 mg total) by mouth daily., Disp: 90 tablet, Rfl: 1  No Known Allergies   ROS  Constitutional: Negative for fever or weight change.  Respiratory: Negative for cough and shortness of breath.   Cardiovascular: Negative for chest pain or palpitations.  Gastrointestinal: Negative for abdominal pain, no bowel changes.  Musculoskeletal: Negative for gait problem or joint swelling.  Skin: Negative for rash.  Neurological: Negative for dizziness or headache.   No other specific complaints in a complete review of systems (except as listed in HPI above).    Objective  There were no vitals filed for this visit.  There is no height or weight on file to calculate BMI. ***  Physical Exam  Constitutional: Patient appears well-developed and well-nourished. No distress.  HENT: Head: Normocephalic and atraumatic. Ears: B TMs ok, no erythema or effusion; Nose: Nose normal. Mouth/Throat: Oropharynx is clear and moist. No oropharyngeal exudate.  Eyes: Conjunctivae and EOM are normal. Pupils are equal, round, and reactive to light. No scleral icterus.  Neck: Normal range of motion. Neck supple. No JVD present. No thyromegaly present.  Cardiovascular: Normal rate, regular rhythm and normal heart sounds.  No murmur heard. No BLE edema. Pulmonary/Chest: Effort normal and breath sounds normal. No respiratory distress. Abdominal: Soft. Bowel sounds are normal, no distension. There is no tenderness. no masses MALE GENITALIA: Normal descended testes bilaterally, no masses palpated, no hernias, no lesions, no discharge RECTAL: not done  Musculoskeletal: Normal range of motion, no joint effusions. No gross deformities Neurological: he is alert and oriented to person, place, and time. No cranial nerve deficit. Coordination, balance, strength, speech and gait are normal.  Skin: Skin is warm and dry. No rash noted. No erythema.  Psychiatric: Patient has a normal mood and affect. behavior is normal. Judgment and thought content normal.    Fall Risk: Fall Risk  12/05/2020 07/29/2020 07/30/2019 01/12/2019 06/27/2018  Falls in the past year? 0 0 0 0 0  Number falls in past yr: 0 0 0 0 0  Injury with Fall? 0 0 0 0 0  Risk for fall due to : No Fall Risks - - - -  Follow up Falls prevention discussed - - - -  Assessment & Plan  1. Dyslipidemia (high LDL; low HDL)  - Lipoprotein Analysis by NMR - rosuvastatin (CRESTOR) 40 MG tablet; Take 1 tablet (40 mg total) by  mouth daily.  Dispense: 90 tablet; Refill: 1  2. At risk for HIV due to homosexual contact   3. Long-term use of high-risk medication   4. Perennial allergic rhinitis with seasonal variation  - azelastine (ASTELIN) 0.1 % nasal spray; Place 1 spray into both nostrils 2 (two) times daily.  Dispense: 90 mL; Refill: 1 - levocetirizine (XYZAL) 5 MG tablet; Take 1 tablet (5 mg total) by mouth every evening.  Dispense: 90 tablet; Refill: 1  5. GAD (generalized anxiety disorder)  - DULoxetine (CYMBALTA) 60 MG capsule; Take 1 capsule (60 mg total) by mouth daily.  Dispense: 90 capsule; Refill: 1  6. Hyperglycemia  - Hemoglobin A1c  7. Well adult exam  - Varicella-zoster vaccine IM - Flu Vaccine QUAD 6+ mos PF IM (Fluarix Quad PF) - CBC with Differential/Platelet - Comprehensive metabolic panel - Hemoglobin A1c - HIV Antibody (routine testing w rflx) - RPR - VITAMIN D 25 Hydroxy (Vit-D Deficiency, Fractures) - Vitamin B12 - TSH - Microalbumin / creatinine urine ratio  8. Hypertension, benign  - CBC with Differential/Platelet - Comprehensive metabolic panel - Microalbumin / creatinine urine ratio  9. Needs flu shot  - Flu Vaccine QUAD 6+ mos PF IM (Fluarix Quad PF)  10. Need for shingles vaccine  - Varicella-zoster vaccine IM  11. Colon cancer screening  - Cologuard    -Prostate cancer screening and PSA options (with potential risks and benefits of testing vs not testing) were discussed along with recent recs/guidelines. -USPSTF grade A and B recommendations reviewed with patient; age-appropriate recommendations, preventive care, screening tests, etc discussed and encouraged; healthy living encouraged; see AVS for patient education given to patient -Discussed importance of 150 minutes of physical activity weekly, eat two servings of fish weekly, eat one serving of tree nuts ( cashews, pistachios, pecans, almonds.Marland Kitchen) every other day, eat 6 servings of fruit/vegetables daily  and drink plenty of water and avoid sweet beverages.

## 2020-12-05 NOTE — Progress Notes (Signed)
Name: Danny Fisher   MRN: [id number]    DOB: May 13, 1965   Date:12/05/2020       Progress Note  Subjective  Chief Complaint  Annual Exam  HPI  Patient presents for annual CPE and follow up  Dyslipidemia: he denies significant history of heart disease , but mother has dyslipidemia and takes cholesterol medication and grandmother had a stroke in her 39's. He has been taking Crestor and denies side effects, we will repeat labs .     AR: symptoms are worse in the Spring. He uses  nasal spray and zyxal daily to control his symptoms. Doing well at this time    GAD: he continues to pick on his nails but not as much as he used to prior to starting on Duloxetine. He has been smoking marijuana to help him sleep.  He does not worry constantly like he use to     Homosexual: married, but open relationship, on Truvada and denies side effects. We will recheck HIV today. He declined GC test  HTN: his bp has been above 140, and during his wellness at work it was over 160, discussed options and we will start with Losartan 50 mg daily, discussed possible side effects including cough and angioedema, if dizzy cut dose in half   IPSS Questionnaire (AUA-7): Over the past month.   1)  How often have you had a sensation of not emptying your bladder completely after you finish urinating?  0 - Not at all  2)  How often have you had to urinate again less than two hours after you finished urinating? 0 - Not at all  3)  How often have you found you stopped and started again several times when you urinated?  0 - Not at all  4) How difficult have you found it to postpone urination?  0 - Not at all  5) How often have you had a weak urinary stream?  0 - Not at all  6) How often have you had to push or strain to begin urination?  0 - Not at all  7) How many times did you most typically get up to urinate from the time you went to bed until the time you got up in the morning?  1 - 1 time  Total score:  0-7 mildly symptomatic    8-19 moderately symptomatic   20-35 severely symptomatic     Diet: discussed importance of portion control and healthy diet  Exercise: needs to increase to 150 minutes per week   Depression: phq 9 is negative Depression screen [facility] 2/9 12/05/2020 07/29/2020 07/30/2019 01/12/2019 06/27/2018  Decreased Interest 0 0 0 0 0  Down, Depressed, Hopeless 0 0 0 0 0  PHQ - 2 Score 0 0 0 0 0  Altered sleeping - 0 0 0 0  Tired, decreased energy - 0 0 1 1  Change in appetite - 0 0 0 0  Feeling bad or failure about yourself  - 0 0 0 0  Trouble concentrating - 0 0 0 0  Moving slowly or fidgety/restless - 0 0 0 0  Suicidal thoughts - 0 0 0 0  PHQ-9 Score - 0 0 1 1  Difficult doing work/chores - Not difficult at all - - Not difficult at all    Hypertension:  BP Readings from Last 3 Encounters:  12/05/20 140/82  07/29/20 140/86  12/14/19 (!) 147/95    Obesity: Wt Readings from Last 3 Encounters:  12/05/20 223 lb (101.2  kg)  07/29/20 224 lb 4.8 oz (101.7 kg)  07/30/19 212 lb 9.6 oz (96.4 kg)   BMI Readings from Last 3 Encounters:  12/05/20 32.00 kg/m  07/29/20 32.18 kg/m  07/30/19 30.95 kg/m     Lipids:  Lab Results  Component Value Date   CHOL 315 (A) 10/22/2017   Lab Results  Component Value Date   HDL 42 10/22/2017   Lab Results  Component Value Date   LDLCALC 228 10/22/2017   Lab Results  Component Value Date   TRIG 223 (A) 10/22/2017   No results found for: CHOLHDL No results found for: LDLDIRECT Glucose:  Glucose  Date Value Ref Range Status  03/26/2020 104 (H) 65 - 99 mg/dL Final  06/21/2018 102 (H) 65 - 99 mg/dL Final  12/16/2017 95 65 - 99 mg/dL Final    Flowsheet Row Orders Only from 12/05/2020 in [facility]  AUDIT-C Score 0      Married STD testing and prevention (HIV/chl/gon/syphilis): 03/26/20 Hep C: 03/26/20  Skin cancer: Discussed monitoring for atypical lesions Colorectal cancer: 12/13/17 Prostate cancer: discussed USPTF  guidelines     Lung cancer: Low Dose CT Chest recommended if Age 86-80 years, 30 pack-year currently smoking OR have quit w/in 15years. Patient does not qualify.   AAA:  The USPSTF recommends one-time screening with ultrasonography in men ages 79 to 36 years who have ever smoked ECG:  11/22/17  Vaccines:   Shingrix:today  Pneumonia: educated and discussed with patient. Flu: today   Advanced Care Planning: A voluntary discussion about advance care planning including the explanation and discussion of advance directives.  Discussed health care proxy and Living will, and the patient was able to identify a health care proxy as husband [name]   Patient Active Problem List   Diagnosis Date Noted   Dyslipidemia 11/22/2017   Tympanic membrane rupture, left 11/22/2017   Obesity (BMI 30.0-34.9) 11/22/2017   High risk homosexual behavior 11/22/2017   ADD (attention deficit disorder) 11/22/2017    Past Surgical History:  Procedure Laterality Date    implant first metatarsophalangeal joint right foot Right 12/07/2019   Dr. Doreatha Lew [facility]ERNIA REPAIR  1971    Family History  Problem Relation Age of Onset   Hyperlipidemia Mother    Ovarian cancer Maternal Grandmother    Emphysema Maternal Grandfather        Tobacco User   Stroke Paternal Grandmother     Social History   Socioeconomic History   Marital status: Married    Spouse name: [name]   Number of children: 0   Years of education: Not on file   Highest education level: Bachelor's degree (e.g., BA, AB, BS)  Occupational History   Occupation: Optician, dispensing   Tobacco Use   Smoking status: Former    Packs/day: 0.50    Years: 25.00    Pack years: 12.50    Types: Cigarettes    Start date: 03/22/1984    Quit date: 11/22/2009    Years since quitting: 11.0   Smokeless tobacco: Never  Vaping Use   Vaping Use: Every day   Start date: 11/23/2011   Substances: Nicotine, Flavoring  Substance and Sexual Activity   Alcohol  use: Yes    Comment: very seldom    Drug use: Yes    Types: Marijuana    Comment: usually at night before sleep   Sexual activity: Yes    Partners: Male  Other Topics Concern   Not  on file  Social History Narrative   He is married to [place], he has working for [employer] for since 2009    Social Determinants of Radio broadcast assistant Strain: Low Risk    Difficulty of Paying Living Expenses: Not hard at all  Food Insecurity: No Food Insecurity   Worried About Charity fundraiser in the Last Year: Never true   Arboriculturist in the Last Year: Never true  Transportation Needs: No Transportation Needs   Lack of Transportation (Medical): No   Lack of Transportation (Non-Medical): No  Physical Activity: Insufficiently Active   Days of Exercise per Week: 1 day   Minutes of Exercise per Session: 40 min  Stress: No Stress Concern Present   Feeling of Stress : Not at all  Social Connections: Moderately Isolated   Frequency of Communication with Friends and Family: More than three times a week   Frequency of Social Gatherings with Friends and Family: Once a week   Attends Religious Services: Never   Marine scientist or Organizations: No   Attends Music therapist: Never   Marital Status: Married  Human resources officer Violence: Not At Risk   Fear of Current or Ex-Partner: No   Emotionally Abused: No   Physically Abused: No   Sexually Abused: No     Current Outpatient Medications:    diphenhydrAMINE (BENADRYL ALLERGY) 25 MG tablet, Take 1 tablet (25 mg total) by mouth every 6 (six) hours as needed., Disp: 30 tablet, Rfl: 0   emtricitabine-tenofovir (TRUVADA) 200-300 MG tablet, Take 1 tablet by mouth daily., Disp: 90 tablet, Rfl: 3   Multiple Vitamins-Minerals (MULTIVITAMIN WITH MINERALS) tablet, Take 1 tablet by mouth daily., Disp: 30 tablet, Rfl: 0   azelastine (ASTELIN) 0.1 % nasal spray, Place 1 spray into both nostrils 2 (two) times daily., Disp: 90 mL, Rfl: 1    DULoxetine (CYMBALTA) 60 MG capsule, Take 1 capsule (60 mg total) by mouth daily., Disp: 90 capsule, Rfl: 1   levocetirizine (XYZAL) 5 MG tablet, Take 1 tablet (5 mg total) by mouth every evening., Disp: 90 tablet, Rfl: 1   losartan (COZAAR) 50 MG tablet, Take 1 tablet (50 mg total) by mouth daily., Disp: 90 tablet, Rfl: 0   rosuvastatin (CRESTOR) 40 MG tablet, Take 1 tablet (40 mg total) by mouth daily., Disp: 90 tablet, Rfl: 1  No Known Allergies   ROS  Constitutional: Negative for fever or weight change.  Respiratory: Negative for cough and shortness of breath.   Cardiovascular: Negative for chest pain or palpitations.  Gastrointestinal: Negative for abdominal pain, no bowel changes.  Musculoskeletal: Negative for gait problem or joint swelling.  Skin: Negative for rash.  Neurological: Negative for dizziness or headache.  No other specific complaints in a complete review of systems (except as listed in HPI above).    Objective  Vitals:   12/05/20 1513  BP: 140/82  Pulse: 87  Resp: 16  Temp: 98.4 F (36.9 C)  Weight: 223 lb (101.2 kg)  Height: '5\' 10"'$  (1.778 m)    Body mass index is 32 kg/m.  Physical Exam  Constitutional: Patient appears well-developed and well-nourished. Obese  No distress.  HENT: Head: Normocephalic and atraumatic. Ears: B TMs ok, no erythema or effusion; Nose: Nose normal. Mouth/Throat: Oropharynx is clear and moist. No oropharyngeal exudate.  Eyes: Conjunctivae and EOM are normal. Pupils are equal, round, and reactive to light. No scleral icterus.  Neck: Normal range of motion. Neck  supple. No JVD present. No thyromegaly present.  Cardiovascular: Normal rate, regular rhythm and normal heart sounds.  No murmur heard. No BLE edema. Pulmonary/Chest: Effort normal and breath sounds normal. No respiratory distress. Abdominal: Soft. Bowel sounds are normal, no distension. There is no tenderness. no masses MALE GENITALIA: Normal descended testes  bilaterally, no masses palpated, no hernias, no lesions, no discharge RECTAL: not done  Musculoskeletal: Normal range of motion, no joint effusions. No gross deformities Neurological: he is alert and oriented to person, place, and time. No cranial nerve deficit. Coordination, balance, strength, speech and gait are normal.  Skin: Skin is warm and dry. No rash noted. No erythema.  Psychiatric: Patient has a normal mood and affect. behavior is normal. Judgment and thought content normal.    Fall Risk: Fall Risk  12/05/2020 07/29/2020 07/30/2019 01/12/2019 06/27/2018  Falls in the past year? 0 0 0 0 0  Number falls in past yr: 0 0 0 0 0  Injury with Fall? 0 0 0 0 0  Risk for fall due to : No Fall Risks - - - -  Follow up Falls prevention discussed - - - -      Functional Status Survey: Is the patient deaf or have difficulty hearing?: No Does the patient have difficulty seeing, even when wearing glasses/contacts?: No Does the patient have difficulty concentrating, remembering, or making decisions?: No Does the patient have difficulty walking or climbing stairs?: No Does the patient have difficulty dressing or bathing?: No Does the patient have difficulty doing errands alone such as visiting a doctor's office or shopping?: No    Assessment & Plan  1. Dyslipidemia (high LDL; low HDL)   - Lipoprotein Analysis by NMR - rosuvastatin (CRESTOR) 40 MG tablet; Take 1 tablet (40 mg total) by mouth daily.  Dispense: 90 tablet; Refill: 1   2. At risk for HIV due to homosexual contact   HIV screen    3. Long-term use of high-risk medication     4. Perennial allergic rhinitis with seasonal variation   - azelastine (ASTELIN) 0.1 % nasal spray; Place 1 spray into both nostrils 2 (two) times daily.  Dispense: 90 mL; Refill: 1 - levocetirizine (XYZAL) 5 MG tablet; Take 1 tablet (5 mg total) by mouth every evening.  Dispense: 90 tablet; Refill: 1   5. GAD (generalized anxiety disorder)   -  DULoxetine (CYMBALTA) 60 MG capsule; Take 1 capsule (60 mg total) by mouth daily.  Dispense: 90 capsule; Refill: 1   6. Hyperglycemia   - Hemoglobin A1c   7. Well adult exam   - Varicella-zoster vaccine IM - Flu Vaccine QUAD 6+ mos PF IM (Fluarix Quad PF) - CBC with Differential/Platelet - Comprehensive metabolic panel - Hemoglobin A1c - HIV Antibody (routine testing w rflx) - RPR - VITAMIN D 25 Hydroxy (Vit-D Deficiency, Fractures) - Vitamin B12 - TSH - Microalbumin / creatinine urine ratio   8. Hypertension, benign   - CBC with Differential/Platelet - Comprehensive metabolic panel - Microalbumin / creatinine urine ratio   9. Needs flu shot   - Flu Vaccine QUAD 6+ mos PF IM (Fluarix Quad PF)   10. Need for shingles vaccine   - Varicella-zoster vaccine IM   11. Colon cancer screening   - Cologuard    -Prostate cancer screening and PSA options (with potential risks and benefits of testing vs not testing) were discussed along with recent recs/guidelines. -USPSTF grade A and B recommendations reviewed with patient; age-appropriate recommendations, preventive care,  screening tests, etc discussed and encouraged; healthy living encouraged; see AVS for patient education given to patient -Discussed importance of 150 minutes of physical activity weekly, eat two servings of fish weekly, eat one serving of tree nuts ( cashews, pistachios, pecans, almonds.Marland Kitchen) every other day, eat 6 servings of fruit/vegetables daily and drink plenty of water and avoid sweet beverages.

## 2021-01-20 DIAGNOSIS — I10 Essential (primary) hypertension: Secondary | ICD-10-CM | POA: Diagnosis not present

## 2021-01-20 DIAGNOSIS — Z0000 Encounter for general adult medical examination without abnormal findings: Secondary | ICD-10-CM | POA: Diagnosis not present

## 2021-01-20 DIAGNOSIS — E785 Hyperlipidemia, unspecified: Secondary | ICD-10-CM | POA: Diagnosis not present

## 2021-01-20 DIAGNOSIS — R739 Hyperglycemia, unspecified: Secondary | ICD-10-CM | POA: Diagnosis not present

## 2021-01-22 ENCOUNTER — Encounter: Payer: Self-pay | Admitting: Family Medicine

## 2021-01-22 LAB — MICROALBUMIN / CREATININE URINE RATIO
Creatinine, Urine: 147.8 mg/dL
Microalb/Creat Ratio: 36 mg/g creat — ABNORMAL HIGH (ref 0–29)
Microalbumin, Urine: 53.5 ug/mL

## 2021-01-22 LAB — COMPREHENSIVE METABOLIC PANEL
ALT: 46 IU/L — ABNORMAL HIGH (ref 0–44)
AST: 37 IU/L (ref 0–40)
Albumin/Globulin Ratio: 2.3 — ABNORMAL HIGH (ref 1.2–2.2)
Albumin: 4.6 g/dL (ref 3.8–4.9)
Alkaline Phosphatase: 97 IU/L (ref 44–121)
BUN/Creatinine Ratio: 14 (ref 9–20)
BUN: 15 mg/dL (ref 6–24)
Bilirubin Total: 0.6 mg/dL (ref 0.0–1.2)
CO2: 24 mmol/L (ref 20–29)
Calcium: 9.2 mg/dL (ref 8.7–10.2)
Chloride: 104 mmol/L (ref 96–106)
Creatinine, Ser: 1.06 mg/dL (ref 0.76–1.27)
Globulin, Total: 2 g/dL (ref 1.5–4.5)
Glucose: 104 mg/dL — ABNORMAL HIGH (ref 70–99)
Potassium: 4.7 mmol/L (ref 3.5–5.2)
Sodium: 143 mmol/L (ref 134–144)
Total Protein: 6.6 g/dL (ref 6.0–8.5)
eGFR: 83 mL/min/{1.73_m2} (ref >59)

## 2021-01-22 LAB — CBC WITH DIFFERENTIAL/PLATELET
Basophils Absolute: 0 10*3/uL (ref 0.0–0.2)
Basos: 1 %
EOS (ABSOLUTE): 0.1 10*3/uL (ref 0.0–0.4)
Eos: 2 %
Hematocrit: 46.2 % (ref 37.5–51.0)
Hemoglobin: 15.9 g/dL (ref 13.0–17.7)
Immature Grans (Abs): 0 10*3/uL (ref 0.0–0.1)
Immature Granulocytes: 0 %
Lymphocytes Absolute: 1.5 10*3/uL (ref 0.7–3.1)
Lymphs: 34 %
MCH: 29.9 pg (ref 26.6–33.0)
MCHC: 34.4 g/dL (ref 31.5–35.7)
MCV: 87 fL (ref 79–97)
Monocytes Absolute: 0.3 10*3/uL (ref 0.1–0.9)
Monocytes: 7 %
Neutrophils Absolute: 2.4 10*3/uL (ref 1.4–7.0)
Neutrophils: 56 %
Platelets: 174 10*3/uL (ref 150–450)
RBC: 5.32 x10E6/uL (ref 4.14–5.80)
RDW: 13.1 % (ref 11.6–15.4)
WBC: 4.3 10*3/uL (ref 3.4–10.8)

## 2021-01-22 LAB — LIPOPROTEIN ANALYSIS BY NMR
HDL Particle Number: 33.6 umol/L (ref >=30.5)
LDL Particle Number: 880 nmol/L (ref <1000)
LDL Size: 19.8 nm — ABNORMAL LOW (ref >20.5)
LP-IR Score: 71 — ABNORMAL HIGH (ref <=45)
Small LDL Particle Number: 605 nmol/L — ABNORMAL HIGH (ref <=527)

## 2021-01-22 LAB — HEMOGLOBIN A1C
Est. average glucose Bld gHb Est-mCnc: 111 mg/dL
Hgb A1c MFr Bld: 5.5 % (ref 4.8–5.6)

## 2021-01-22 LAB — RPR: RPR Ser Ql: NONREACTIVE

## 2021-01-22 LAB — HIV ANTIBODY (ROUTINE TESTING W REFLEX): HIV Screen 4th Generation wRfx: NONREACTIVE

## 2021-01-22 LAB — TSH: TSH: 1.33 u[IU]/mL (ref 0.450–4.500)

## 2021-01-22 LAB — VITAMIN D 25 HYDROXY (VIT D DEFICIENCY, FRACTURES): Vit D, 25-Hydroxy: 34.8 ng/mL (ref 30.0–100.0)

## 2021-01-22 LAB — VITAMIN B12: Vitamin B-12: 513 pg/mL (ref 232–1245)

## 2021-01-29 ENCOUNTER — Ambulatory Visit: Payer: BC Managed Care – PPO | Admitting: Family Medicine

## 2021-02-05 ENCOUNTER — Other Ambulatory Visit: Payer: Self-pay | Admitting: Family Medicine

## 2021-02-05 NOTE — Telephone Encounter (Signed)
Requested Prescriptions  Pending Prescriptions Disp Refills  . losartan (COZAAR) 50 MG tablet [Pharmacy Med Name: Losartan Potassium 50 MG Oral Tablet] 90 tablet 0    Sig: TAKE 1 TABLET BY MOUTH  DAILY     Cardiovascular:  Angiotensin Receptor Blockers Failed - 02/05/2021  6:06 AM      Failed - Last BP in normal range    BP Readings from Last 1 Encounters:  12/05/20 140/82         Passed - Cr in normal range and within 180 days    Creatinine, Ser  Date Value Ref Range Status  01/20/2021 1.06 0.76 - 1.27 mg/dL Final         Passed - K in normal range and within 180 days    Potassium  Date Value Ref Range Status  01/20/2021 4.7 3.5 - 5.2 mmol/L Final         Passed - Patient is not pregnant      Passed - Valid encounter within last 6 months    Recent Outpatient Visits          2 months ago Dyslipidemia (high LDL; low HDL)   [facility] [place], Drue Stager, MD   6 months ago Dyslipidemia (high LDL; low HDL)   [facility] Steele Sizer, MD   1 year ago Dyslipidemia (high LDL; low HDL)   [facility] [place], Drue Stager, MD   2 years ago Dyslipidemia (high LDL; low HDL)   [facility] Steele Sizer, MD   2 years ago GAD (generalized anxiety disorder)   [facility] Steele Sizer, MD      Future Appointments            In 4 months Ancil Boozer, Drue Stager, MD [facility], [facility]

## 2021-02-06 ENCOUNTER — Other Ambulatory Visit: Payer: Self-pay

## 2021-02-06 ENCOUNTER — Ambulatory Visit (INDEPENDENT_AMBULATORY_CARE_PROVIDER_SITE_OTHER): Payer: BC Managed Care – PPO

## 2021-02-06 DIAGNOSIS — I10 Essential (primary) hypertension: Secondary | ICD-10-CM

## 2021-02-06 DIAGNOSIS — Z23 Encounter for immunization: Secondary | ICD-10-CM

## 2021-02-06 NOTE — Progress Notes (Signed)
Pt here for Blood pressure check since starting losartan.  Pt denies any side effects and bp today is 138/84.  Please advise.  Pt was told we will call him if any changes need to be made.

## 2021-02-08 ENCOUNTER — Other Ambulatory Visit: Payer: Self-pay | Admitting: Family Medicine

## 2021-02-08 MED ORDER — LOSARTAN POTASSIUM 100 MG PO TABS: 100.0000 mg | ORAL_TABLET | Freq: Every day | ORAL | 1 refills | Status: DC

## 2021-02-09 DIAGNOSIS — Z1211 Encounter for screening for malignant neoplasm of colon: Secondary | ICD-10-CM | POA: Diagnosis not present

## 2021-02-16 LAB — COLOGUARD: COLOGUARD: NEGATIVE

## 2021-03-24 ENCOUNTER — Other Ambulatory Visit: Payer: Self-pay | Admitting: Family Medicine

## 2021-03-24 DIAGNOSIS — E785 Hyperlipidemia, unspecified: Secondary | ICD-10-CM

## 2021-03-27 ENCOUNTER — Telehealth: Payer: Self-pay

## 2021-03-27 NOTE — Telephone Encounter (Signed)
Called [facility] spoke to pharmacist Geoffery Spruce, confirmed that I had faxed over the papers they had sent for pt rx. I stated to him Dr.Andrews reviewed it due to Dr.Sowles being out of the office. Per Dr.Andrews Losartan 50mg  needs to be discontinued and Losartan 100mg  is the one active.

## 2021-03-27 NOTE — Telephone Encounter (Signed)
Copied from [place] [phone]. Topic: Quick Communication - Rx Refill/Question >> Mar 27, 2021  2:31 PM [place], Apolonio Schneiders wrote: [facility] [id number]) stated they sent a few faxes over to get some clarification on the pts medication, caller needed to know if the pt is taking 50mg  or 100mg  of losartan (COZAAR), please advise.

## 2021-06-04 ENCOUNTER — Other Ambulatory Visit: Payer: Self-pay | Admitting: Family Medicine

## 2021-06-04 NOTE — Progress Notes (Signed)
Name: Danny Fisher   MRN: [id number]    DOB: [birth year]   Date:06/05/2021 ? ?     Progress Note ? ?Subjective ? ?Chief Complaint ? ?Follow Up ? ?HPI ? ?Dyslipidemia: he denies significant history of heart disease , but mother has dyslipidemia and takes cholesterol medication and grandmother had a stroke in her 68's. He has been taking Crestor and denies side effects, LDL still not at goal and we will add Zetia  ?   ?AR: symptoms are worse in the Spring. He uses nasal spray and zyxal daily to help with symptoms. He states recently had to take mucinex for nasal congestion, discussed saline spray .  ?  ?GAD: he continues to pick on his nails but not as much as he used to prior to starting on Duloxetine. He has been smoking marijuana to help him sleep.  He does not worry constantly like he use to. He is stable on medication  ?  ?Homosexual: married, but open relationship, on Truvada and denies side effects. We will recheck HIV today and RPR but he declined GC test ? ?HTN: his bp has been above 140, and last Summer we started him on Losartan, currently at 100 mg and bp is still above goal and positive micro albumin, we will try switching to diovan hctz and if still shows elevation in urine micro and not responsive to therapy we will check renal artery [place] ? ?Patient Active Problem List  ? Diagnosis Date Noted  ? Dyslipidemia 11/22/2017  ? Tympanic membrane rupture, left 11/22/2017  ? Obesity (BMI 30.0-34.9) 11/22/2017  ? At risk for HIV due to homosexual contact 11/22/2017  ? ADD (attention deficit disorder) 11/22/2017  ? ? ?Past Surgical History:  ?Procedure Laterality Date  ?  implant first metatarsophalangeal joint right foot Right 12/07/2019  ? Dr. Milinda Pointer   ? [place]  ? ? ?Family History  ?Problem Relation Age of Onset  ? Hyperlipidemia Mother   ? Ovarian cancer Maternal Grandmother   ? Emphysema Maternal Grandfather   ?     Tobacco User  ? Stroke Paternal Grandmother   ? ? ?Social History  ? ?Tobacco  Use  ? Smoking status: Former  ?  Packs/day: 0.50  ?  Years: 25.00  ?  Pack years: 12.50  ?  Types: Cigarettes  ?  Start date: 03/22/1984  ?  Quit date: 11/22/2009  ?  Years since quitting: 11.5  ? Smokeless tobacco: Never  ?Substance Use Topics  ? Alcohol use: Yes  ?  Comment: very seldom   ? ? ? ?Current Outpatient Medications:  ?  diphenhydrAMINE (BENADRYL ALLERGY) 25 MG tablet, Take 1 tablet (25 mg total) by mouth every 6 (six) hours as needed., Disp: 30 tablet, Rfl: 0 ?  ezetimibe (ZETIA) 10 MG tablet, Take 1 tablet (10 mg total) by mouth daily., Disp: 90 tablet, Rfl: 1 ?  Multiple Vitamins-Minerals (MULTIVITAMIN WITH MINERALS) tablet, Take 1 tablet by mouth daily., Disp: 30 tablet, Rfl: 0 ?  valsartan-hydrochlorothiazide (DIOVAN-HCT) 160-12.5 MG tablet, Take 1 tablet by mouth daily. In place of losartan, Disp: 90 tablet, Rfl: 0 ?  azelastine (ASTELIN) 0.1 % nasal spray, Place 1 spray into both nostrils 2 (two) times daily., Disp: 90 mL, Rfl: 1 ?  DULoxetine (CYMBALTA) 60 MG capsule, Take 1 capsule (60 mg total) by mouth daily., Disp: 90 capsule, Rfl: 1 ?  emtricitabine-tenofovir (TRUVADA) 200-300 MG tablet, Take 1 tablet by mouth daily., Disp: 90 tablet, Rfl: 1 ?  levocetirizine (XYZAL) 5 MG tablet, Take 1 tablet (5 mg total) by mouth every evening., Disp: 90 tablet, Rfl: 1 ?  rosuvastatin (CRESTOR) 40 MG tablet, Take 1 tablet (40 mg total) by mouth daily., Disp: 90 tablet, Rfl: 1 ? ?No Known Allergies ? ?I personally reviewed active problem list, medication list, allergies, family history, social history, health maintenance with the patient/caregiver today. ? ? ?ROS ? ?Constitutional: Negative for fever or weight change.  ?Respiratory: Negative for cough and shortness of breath.   ?Cardiovascular: Negative for chest pain or palpitations.  ?Gastrointestinal: Negative for abdominal pain, no bowel changes.  ?Musculoskeletal: Negative for gait problem or joint swelling.  ?Skin: Negative for rash.  ?Neurological:  Negative for dizziness or headache.  ?No other specific complaints in a complete review of systems (except as listed in HPI above).  ? ?Objective ? ?Vitals:  ? 06/05/21 1456 06/05/21 1501  ?BP: (!) 148/78 (!) 146/80  ?Pulse: 98   ?Resp: 16   ?Temp: 98 ?F (36.7 ?C)   ?TempSrc: Oral   ?SpO2: 99%   ?Weight: 229 lb 11.2 oz (104.2 kg)   ?Height: '5\' 10"'$  (1.778 m)   ? ? ?Body mass index is 32.96 kg/m?. ? ?Physical Exam ? ?Constitutional: Patient appears well-developed and well-nourished. Obese  No distress.  ?HEENT: head atraumatic, normocephalic, pupils equal and reactive to light, neck supple ?Cardiovascular: Normal rate, regular rhythm and normal heart sounds.  No murmur heard. No BLE edema. ?Pulmonary/Chest: Effort normal and breath sounds normal. No respiratory distress. ?Abdominal: Soft.  There is no tenderness. ?Psychiatric: Patient has a normal mood and affect. behavior is normal. Judgment and thought content normal.  ? ?PHQ2/9: ?Depression screen [facility] 2/9 06/05/2021 12/05/2020 07/29/2020 07/30/2019 01/12/2019  ?Decreased Interest 0 0 0 0 0  ?Down, Depressed, Hopeless 0 0 0 0 0  ?PHQ - 2 Score 0 0 0 0 0  ?Altered sleeping 0 - 0 0 0  ?Tired, decreased energy 0 - 0 0 1  ?Change in appetite 0 - 0 0 0  ?Feeling bad or failure about yourself  0 - 0 0 0  ?Trouble concentrating 0 - 0 0 0  ?Moving slowly or fidgety/restless 0 - 0 0 0  ?Suicidal thoughts 0 - 0 0 0  ?PHQ-9 Score 0 - 0 0 1  ?Difficult doing work/chores Not difficult at all - Not difficult at all - -  ?  ?phq 9 is negative ? ? ?Fall Risk: ?Fall Risk  06/05/2021 12/05/2020 07/29/2020 07/30/2019 01/12/2019  ?Falls in the past year? 0 0 0 0 0  ?Number falls in past yr: 0 0 0 0 0  ?Injury with Fall? 0 0 0 0 0  ?Risk for fall due to : No Fall Risks No Fall Risks - - -  ?Follow up Falls prevention discussed Falls prevention discussed - - -  ? ? ? ?Assessment & Plan ? ?1. Hypertension, benign ? ?- valsartan-hydrochlorothiazide (DIOVAN-HCT) 160-12.5 MG tablet; Take 1 tablet by  mouth daily. In place of losartan  Dispense: 90 tablet; Refill: 0 ?- Microalbumin / creatinine urine ratio ?- Comprehensive metabolic panel ? ?2. Microalbuminuria ? ?- Microalbumin / creatinine urine ratio ? ?3. At risk for HIV due to homosexual contact ? ?- emtricitabine-tenofovir (TRUVADA) 200-300 MG tablet; Take 1 tablet by mouth daily.  Dispense: 90 tablet; Refill: 1 ? ?4. GAD (generalized anxiety disorder) ? ?- DULoxetine (CYMBALTA) 60 MG capsule; Take 1 capsule (60 mg total) by mouth daily.  Dispense: 90 capsule; Refill: 1 ? ?  5. Perennial allergic rhinitis with seasonal variation ? ?- azelastine (ASTELIN) 0.1 % nasal spray; Place 1 spray into both nostrils 2 (two) times daily.  Dispense: 90 mL; Refill: 1 ?- levocetirizine (XYZAL) 5 MG tablet; Take 1 tablet (5 mg total) by mouth every evening.  Dispense: 90 tablet; Refill: 1 ? ?6. Dyslipidemia (high LDL; low HDL) ? ?- rosuvastatin (CRESTOR) 40 MG tablet; Take 1 tablet (40 mg total) by mouth daily.  Dispense: 90 tablet; Refill: 1 ?- ezetimibe (ZETIA) 10 MG tablet; Take 1 tablet (10 mg total) by mouth daily.  Dispense: 90 tablet; Refill: 1 ?- Lipid panel ? ?7. Routine screening for STI (sexually transmitted infection) ? ?- HIV Antibody (routine testing w rflx) ?- RPR  ?

## 2021-06-05 ENCOUNTER — Ambulatory Visit: Payer: BC Managed Care – PPO | Admitting: Family Medicine

## 2021-06-05 ENCOUNTER — Other Ambulatory Visit: Payer: Self-pay

## 2021-06-05 ENCOUNTER — Encounter: Payer: Self-pay | Admitting: Family Medicine

## 2021-06-05 VITALS — BP 146/80 | HR 98 | Temp 98.0°F | Resp 16 | Ht 70.0 in | Wt 229.7 lb

## 2021-06-05 DIAGNOSIS — I10 Essential (primary) hypertension: Secondary | ICD-10-CM

## 2021-06-05 DIAGNOSIS — E785 Hyperlipidemia, unspecified: Secondary | ICD-10-CM

## 2021-06-05 DIAGNOSIS — F411 Generalized anxiety disorder: Secondary | ICD-10-CM

## 2021-06-05 DIAGNOSIS — Z9189 Other specified personal risk factors, not elsewhere classified: Secondary | ICD-10-CM

## 2021-06-05 DIAGNOSIS — R809 Proteinuria, unspecified: Secondary | ICD-10-CM

## 2021-06-05 DIAGNOSIS — Z113 Encounter for screening for infections with a predominantly sexual mode of transmission: Secondary | ICD-10-CM

## 2021-06-05 DIAGNOSIS — J3089 Other allergic rhinitis: Secondary | ICD-10-CM

## 2021-06-05 DIAGNOSIS — J302 Other seasonal allergic rhinitis: Secondary | ICD-10-CM

## 2021-06-05 MED ORDER — LEVOCETIRIZINE DIHYDROCHLORIDE 5 MG PO TABS: 5.0000 mg | ORAL_TABLET | Freq: Every evening | ORAL | 1 refills | Status: DC

## 2021-06-05 MED ORDER — ROSUVASTATIN CALCIUM 40 MG PO TABS: 40.0000 mg | ORAL_TABLET | Freq: Every day | ORAL | 1 refills | Status: DC

## 2021-06-05 MED ORDER — VALSARTAN-HYDROCHLOROTHIAZIDE 160-12.5 MG PO TABS: 1.0000 | ORAL_TABLET | Freq: Every day | ORAL | 0 refills | Status: DC

## 2021-06-05 MED ORDER — DULOXETINE HCL 60 MG PO CPEP: 60.0000 mg | ORAL_CAPSULE | Freq: Every day | ORAL | 1 refills | Status: DC

## 2021-06-05 MED ORDER — EZETIMIBE 10 MG PO TABS: 10.0000 mg | ORAL_TABLET | Freq: Every day | ORAL | 1 refills | Status: DC

## 2021-06-05 MED ORDER — EMTRICITABINE-TENOFOVIR DF 200-300 MG PO TABS: 1.0000 | ORAL_TABLET | Freq: Every day | ORAL | 1 refills | Status: DC

## 2021-06-05 MED ORDER — AZELASTINE HCL 0.1 % NA SOLN: 1.0000 | Freq: Two times a day (BID) | NASAL | 1 refills | Status: DC

## 2021-06-08 ENCOUNTER — Telehealth: Payer: Self-pay

## 2021-06-08 NOTE — Telephone Encounter (Signed)
Copied from [place] [phone]. Topic: General - Other ?>> Jun 08, 2021  1:40 PM Holley Dexter N wrote: ?Reason for CRM: [facility] Prior Authorization dept. called in on behalf of the pt stating the medication emtricitabine-tenofovir (TRUVADA) 200-300 MG tablet if it is needing a PA, it can be sent over to them at [phone] - [phone]. Please advise. ?

## 2021-06-08 NOTE — Telephone Encounter (Signed)
Copied from [place] [phone]. Topic: General - Other ?>> Jun 08, 2021  4:18 PM Yvette Rack wrote: ?Reason for CRM: [facility] Rep requests office notes regarding Rx for emtricitabine-tenofovir (TRUVADA) 200-300 MG tablet. Cb# [phone]  fax# [phone] ?

## 2021-06-08 NOTE — Telephone Encounter (Signed)
PA initiated over the phone with [facility], chart notes faxed.  ?

## 2021-06-10 ENCOUNTER — Telehealth: Payer: Self-pay

## 2021-06-10 NOTE — Telephone Encounter (Signed)
Returned their call, they are in process of appeal.  ?

## 2021-06-10 NOTE — Telephone Encounter (Signed)
Copied from [place] [id number]. Topic: General - Other ?>> Jun 10, 2021 11:27 AM Bayard Beaver wrote: ?Reason for CRM: Gerrick, from [facility] called in with questions on PA for Truvada med. ?

## 2021-08-06 ENCOUNTER — Other Ambulatory Visit: Payer: Self-pay | Admitting: Family Medicine

## 2021-08-06 DIAGNOSIS — I10 Essential (primary) hypertension: Secondary | ICD-10-CM

## 2021-10-01 ENCOUNTER — Telehealth: Payer: Self-pay | Admitting: Family Medicine

## 2021-10-01 DIAGNOSIS — I10 Essential (primary) hypertension: Secondary | ICD-10-CM

## 2021-10-02 NOTE — Telephone Encounter (Signed)
Rx 08/06/21 #90 - too soon Requested Prescriptions  Pending Prescriptions Disp Refills  . valsartan-hydrochlorothiazide (DIOVAN-HCT) 160-12.5 MG tablet [Pharmacy Med Name: Valsartan-hydroCHLOROthiazide 160-12.5 MG Oral Tablet] 90 tablet 3    Sig: TAKE 1 TABLET BY MOUTH DAILY IN  PLACE OF [place]     Cardiovascular: ARB + Diuretic Combos Failed - 10/01/2021 11:09 PM      Failed - K in normal range and within 180 days    Potassium  Date Value Ref Range Status  01/20/2021 4.7 3.5 - 5.2 mmol/L Final         Failed - Na in normal range and within 180 days    Sodium  Date Value Ref Range Status  01/20/2021 143 134 - 144 mmol/L Final         Failed - Cr in normal range and within 180 days    Creatinine, Ser  Date Value Ref Range Status  01/20/2021 1.06 0.76 - 1.27 mg/dL Final         Failed - eGFR is 10 or above and within 180 days    GFR calc Af Amer  Date Value Ref Range Status  03/26/2020 103 >59 mL/min/1.73 Final    Comment:    **In accordance with recommendations from the NKF-ASN Task force,**   [facility] is in the process of updating its eGFR calculation to the   2021 CKD-EPI creatinine equation that estimates kidney function   without a race variable.    GFR calc non Af Amer  Date Value Ref Range Status  03/26/2020 89 >59 mL/min/1.73 Final   eGFR  Date Value Ref Range Status  01/20/2021 83 >59 mL/min/1.73 Final         Failed - Last BP in normal range    BP Readings from Last 1 Encounters:  06/05/21 (!) 146/80         Passed - Patient is not pregnant      Passed - Valid encounter within last 6 months    Recent Outpatient Visits          3 months ago Hypertension, benign   [facility] [place], Drue Stager, MD   10 months ago Dyslipidemia (high LDL; low HDL)   [facility] Steele Sizer, MD   1 year ago Dyslipidemia (high LDL; low HDL)   [facility] [place], Drue Stager, MD   2 years ago Dyslipidemia (high  LDL; low HDL)   [facility] [place], Drue Stager, MD   2 years ago Dyslipidemia (high LDL; low HDL)   [facility] Steele Sizer, MD      Future Appointments            In 5 days Steele Sizer, MD [facility]

## 2021-10-05 DIAGNOSIS — I10 Essential (primary) hypertension: Secondary | ICD-10-CM | POA: Diagnosis not present

## 2021-10-05 DIAGNOSIS — Z113 Encounter for screening for infections with a predominantly sexual mode of transmission: Secondary | ICD-10-CM | POA: Diagnosis not present

## 2021-10-05 DIAGNOSIS — E785 Hyperlipidemia, unspecified: Secondary | ICD-10-CM | POA: Diagnosis not present

## 2021-10-05 DIAGNOSIS — R809 Proteinuria, unspecified: Secondary | ICD-10-CM | POA: Diagnosis not present

## 2021-10-06 NOTE — Progress Notes (Unsigned)
Name: Danny Fisher   MRN: [id number]    DOB: 1965-09-26   Date:10/07/2021       Progress Note  Subjective  Chief Complaint  Follow Up  HPI  Dyslipidemia: he denies significant history of heart disease , but mother has dyslipidemia and takes cholesterol medication and grandmother had a stroke in her 75's. He has been taking Crestor and Zetia now, he recently had biometric screen test done and LDL is at goal     AR: symptoms are worse in the Spring. He uses nasal spray and zyxal daily to help with symptoms. Symptoms stable    GAD: he continues to pick on his nails but not as much as he used to prior to starting on Duloxetine. He has been smoking marijuana to help him sleep.  He does not worry constantly like he use to. He is stable on medication    Homosexual: married, but open relationship, on Truvada and denies side effects. He had HIV and RPR done on Monday but results are pending   HTN: his bp was above 140 for multiple visits, we changed from Losartan to valsartan hctz Spring 2023. Today his bp is at [facility], by his heart rate is elevated. He was unable to give urine for microalbumin/creatinine ration. He denies chest pain, palpitation, dizziness, thyroid pain, SOB or fatigue. He is feeling fine.   Patient Active Problem List   Diagnosis Date Noted   Dyslipidemia 11/22/2017   Tympanic membrane rupture, left 11/22/2017   Obesity (BMI 30.0-34.9) 11/22/2017   At risk for HIV due to homosexual contact 11/22/2017   ADD (attention deficit disorder) 11/22/2017    Past Surgical History:  Procedure Laterality Date    implant first metatarsophalangeal joint right foot Right 12/07/2019   Dr. Doreatha Lew HERNIA REPAIR  1971    Family History  Problem Relation Age of Onset   Hyperlipidemia Mother    Ovarian cancer Maternal Grandmother    Emphysema Maternal Grandfather        Tobacco User   Stroke Paternal Grandmother     Social History   Tobacco Use   Smoking status: Former     Packs/day: 0.50    Years: 25.00    Total pack years: 12.50    Types: Cigarettes    Start date: 03/22/1984    Quit date: 11/22/2009    Years since quitting: 11.8   Smokeless tobacco: Never  Substance Use Topics   Alcohol use: Yes    Comment: very seldom      Current Outpatient Medications:    atenolol (TENORMIN) 25 MG tablet, Take 1 tablet (25 mg total) by mouth daily., Disp: 90 tablet, Rfl: 3   azelastine (ASTELIN) 0.1 % nasal spray, Place 1 spray into both nostrils 2 (two) times daily., Disp: 90 mL, Rfl: 1   diphenhydrAMINE (BENADRYL ALLERGY) 25 MG tablet, Take 1 tablet (25 mg total) by mouth every 6 (six) hours as needed., Disp: 30 tablet, Rfl: 0   Multiple Vitamins-Minerals (MULTIVITAMIN WITH MINERALS) tablet, Take 1 tablet by mouth daily., Disp: 30 tablet, Rfl: 0   DULoxetine (CYMBALTA) 60 MG capsule, Take 1 capsule (60 mg total) by mouth daily., Disp: 90 capsule, Rfl: 1   emtricitabine-tenofovir (TRUVADA) 200-300 MG tablet, Take 1 tablet by mouth daily., Disp: 90 tablet, Rfl: 1   ezetimibe (ZETIA) 10 MG tablet, Take 1 tablet (10 mg total) by mouth daily., Disp: 90 tablet, Rfl: 1   levocetirizine (XYZAL) 5 MG tablet, Take 1 tablet (  5 mg total) by mouth every evening., Disp: 90 tablet, Rfl: 1   rosuvastatin (CRESTOR) 40 MG tablet, Take 1 tablet (40 mg total) by mouth daily., Disp: 90 tablet, Rfl: 1   valsartan-hydrochlorothiazide (DIOVAN-HCT) 160-12.5 MG tablet, Take 1 tablet by mouth daily. In place of losartan, Disp: 90 tablet, Rfl: 1  No Known Allergies  I personally reviewed active problem list, medication list, allergies, family history, social history, health maintenance with the patient/caregiver today.   ROS  Constitutional: Negative for fever or weight change.  Respiratory: Negative for cough and shortness of breath.   Cardiovascular: Negative for chest pain or palpitations.  Gastrointestinal: Negative for abdominal pain, no bowel changes.  Musculoskeletal: Negative for  gait problem or joint swelling.  Skin: Negative for rash.  Neurological: Negative for dizziness or headache.  No other specific complaints in a complete review of systems (except as listed in HPI above).   Objective  Vitals:   10/07/21 1526 10/07/21 1542  BP: 132/72   Pulse: (!) 134 (!) 112  Resp: 18   Temp: 98.4 F (36.9 C)   TempSrc: Oral   SpO2: 98%   Weight: 227 lb 8 oz (103.2 kg)   Height: 5' 10.5" (1.791 m)     Body mass index is 32.18 kg/m.  Physical Exam  Constitutional: Patient appears well-developed and well-nourished. Obese  No distress.  HEENT: head atraumatic, normocephalic, pupils equal and reactive to light,, neck supple, throat within normal limits, thyroid not tender Cardiovascular: tachycardia  regular rhythm and normal heart sounds.  No murmur heard. No BLE edema. Pulmonary/Chest: Effort normal and breath sounds normal. No respiratory distress. Abdominal: Soft.  There is no tenderness. Psychiatric: Patient has a normal mood and affect. behavior is normal. Judgment and thought content normal.   Recent Results (from the past 2160 hour(s))  Microalbumin / creatinine urine ratio     Status: None (Preliminary result)   Collection Time: 10/05/21  9:58 AM  Result Value Ref Range   Creatinine, Urine WILL FOLLOW    Microalbumin, Urine WILL FOLLOW    Microalb/Creat Ratio WILL FOLLOW   Comprehensive metabolic panel     Status: None (Preliminary result)   Collection Time: 10/05/21  9:58 AM  Result Value Ref Range   Glucose WILL FOLLOW    BUN WILL FOLLOW    Creatinine, Ser WILL FOLLOW    eGFR WILL FOLLOW    BUN/Creatinine Ratio WILL FOLLOW    Sodium WILL FOLLOW    Potassium WILL FOLLOW    Chloride WILL FOLLOW    CO2 WILL FOLLOW    Calcium WILL FOLLOW    Total Protein WILL FOLLOW    Albumin WILL FOLLOW    Globulin, Total WILL FOLLOW    Albumin/Globulin Ratio WILL FOLLOW    Bilirubin Total WILL FOLLOW    Alkaline Phosphatase WILL FOLLOW    AST WILL FOLLOW     ALT WILL FOLLOW   Lipid panel     Status: None (Preliminary result)   Collection Time: 10/05/21  9:58 AM  Result Value Ref Range   Cholesterol, Total WILL FOLLOW    Triglycerides WILL FOLLOW    HDL WILL FOLLOW    VLDL Cholesterol Cal WILL FOLLOW    LDL Chol Calc (NIH) WILL FOLLOW    Lipid Comment: WILL FOLLOW    Chol/HDL Ratio WILL FOLLOW   HIV Antibody (routine testing w rflx)     Status: None   Collection Time: 10/05/21  9:58 AM  Result Value  Ref Range   HIV Screen 4th Generation wRfx Non Reactive Non Reactive    Comment: HIV Negative HIV-1/HIV-2 antibodies and HIV-1 p24 antigen were NOT detected. There is no laboratory evidence of HIV infection.   RPR     Status: None (Preliminary result)   Collection Time: 10/05/21  9:58 AM  Result Value Ref Range   RPR Ser Ql WILL FOLLOW      PHQ2/9:    10/07/2021    3:42 PM 06/05/2021    2:54 PM 12/05/2020    3:13 PM 07/29/2020   10:35 AM 07/30/2019    2:51 PM  Depression screen PHQ 2/9  Decreased Interest 0 0 0 0 0  Down, Depressed, Hopeless 0 0 0 0 0  PHQ - 2 Score 0 0 0 0 0  Altered sleeping 1 0  0 0  Tired, decreased energy 1 0  0 0  Change in appetite 0 0  0 0  Feeling bad or failure about yourself  0 0  0 0  Trouble concentrating 0 0  0 0  Moving slowly or fidgety/restless 0 0  0 0  Suicidal thoughts 0 0  0 0  PHQ-9 Score 2 0  0 0  Difficult doing work/chores Not difficult at all Not difficult at all  Not difficult at all     phq 9 is negative   Fall Risk:    10/07/2021    3:25 PM 06/05/2021    2:54 PM 12/05/2020    3:13 PM 07/29/2020   10:26 AM 07/30/2019    2:51 PM  Fall Risk   Falls in the past year? 0 0 0 0 0  Number falls in past yr:  0 0 0 0  Injury with Fall?  0 0 0 0  Risk for fall due to : No Fall Risks No Fall Risks No Fall Risks    Follow up Falls prevention discussed Falls prevention discussed Falls prevention discussed        Assessment & Plan  1. GAD (generalized anxiety disorder)  -  DULoxetine (CYMBALTA) 60 MG capsule; Take 1 capsule (60 mg total) by mouth daily.  Dispense: 90 capsule; Refill: 1  2. At risk for HIV due to homosexual contact  - emtricitabine-tenofovir (TRUVADA) 200-300 MG tablet; Take 1 tablet by mouth daily.  Dispense: 90 tablet; Refill: 1  3. Dyslipidemia (high LDL; low HDL)  - ezetimibe (ZETIA) 10 MG tablet; Take 1 tablet (10 mg total) by mouth daily.  Dispense: 90 tablet; Refill: 1 - rosuvastatin (CRESTOR) 40 MG tablet; Take 1 tablet (40 mg total) by mouth daily.  Dispense: 90 tablet; Refill: 1  4. Perennial allergic rhinitis with seasonal variation  - levocetirizine (XYZAL) 5 MG tablet; Take 1 tablet (5 mg total) by mouth every evening.  Dispense: 90 tablet; Refill: 1  5. Hypertension, benign  - valsartan-hydrochlorothiazide (DIOVAN-HCT) 160-12.5 MG tablet; Take 1 tablet by mouth daily. In place of losartan  Dispense: 90 tablet; Refill: 1  6. Tachycardia  Discussed getting an EKG, but our machine is broken, we will try Atenolol since asymptomatic and regular rhythm  - CBC with Differential/Platelet - Thyroid Panel With TSH - atenolol (TENORMIN) 25 MG tablet; Take 1 tablet (25 mg total) by mouth daily.  Dispense: 90 tablet; Refill: 3

## 2021-10-07 ENCOUNTER — Encounter: Payer: Self-pay | Admitting: Family Medicine

## 2021-10-07 ENCOUNTER — Ambulatory Visit: Payer: BC Managed Care – PPO | Admitting: Family Medicine

## 2021-10-07 VITALS — BP 132/72 | HR 112 | Temp 98.4°F | Resp 18 | Ht 70.5 in | Wt 227.5 lb

## 2021-10-07 DIAGNOSIS — Z9189 Other specified personal risk factors, not elsewhere classified: Secondary | ICD-10-CM

## 2021-10-07 DIAGNOSIS — J302 Other seasonal allergic rhinitis: Secondary | ICD-10-CM

## 2021-10-07 DIAGNOSIS — E785 Hyperlipidemia, unspecified: Secondary | ICD-10-CM

## 2021-10-07 DIAGNOSIS — R000 Tachycardia, unspecified: Secondary | ICD-10-CM

## 2021-10-07 DIAGNOSIS — F411 Generalized anxiety disorder: Secondary | ICD-10-CM

## 2021-10-07 DIAGNOSIS — I10 Essential (primary) hypertension: Secondary | ICD-10-CM

## 2021-10-07 DIAGNOSIS — J3089 Other allergic rhinitis: Secondary | ICD-10-CM

## 2021-10-07 LAB — COMPREHENSIVE METABOLIC PANEL: eGFR: 85

## 2021-10-07 LAB — LIPID PANEL
Cholesterol: 116 (ref 0–200)
HDL: 43 (ref 35–70)
LDL Cholesterol: 38
LDl/HDL Ratio: 2.7
Triglycerides: 222 — AB (ref 40–160)

## 2021-10-07 LAB — BASIC METABOLIC PANEL
Creatinine: 1 (ref <=1.3)
Glucose: 115

## 2021-10-07 MED ORDER — EMTRICITABINE-TENOFOVIR DF 200-300 MG PO TABS: 1.0000 | ORAL_TABLET | Freq: Every day | ORAL | 1 refills | Status: DC

## 2021-10-07 MED ORDER — VALSARTAN-HYDROCHLOROTHIAZIDE 160-12.5 MG PO TABS: 1.0000 | ORAL_TABLET | Freq: Every day | ORAL | 1 refills | Status: DC

## 2021-10-07 MED ORDER — LEVOCETIRIZINE DIHYDROCHLORIDE 5 MG PO TABS: 5.0000 mg | ORAL_TABLET | Freq: Every evening | ORAL | 1 refills | Status: DC

## 2021-10-07 MED ORDER — DULOXETINE HCL 60 MG PO CPEP
60.0000 mg | ORAL_CAPSULE | Freq: Every day | ORAL | 1 refills | Status: DC
Start: 2021-10-07 — End: 2022-05-03

## 2021-10-07 MED ORDER — ROSUVASTATIN CALCIUM 40 MG PO TABS: 40.0000 mg | ORAL_TABLET | Freq: Every day | ORAL | 1 refills | Status: DC

## 2021-10-07 MED ORDER — EZETIMIBE 10 MG PO TABS: 10.0000 mg | ORAL_TABLET | Freq: Every day | ORAL | 1 refills | Status: DC

## 2021-10-07 MED ORDER — ATENOLOL 25 MG PO TABS: 25.0000 mg | ORAL_TABLET | Freq: Every day | ORAL | 3 refills | Status: DC

## 2021-10-08 NOTE — Telephone Encounter (Signed)
Completed.

## 2021-10-08 NOTE — Progress Notes (Signed)
Biometric abstraction.

## 2021-10-11 ENCOUNTER — Other Ambulatory Visit: Payer: Self-pay | Admitting: Family Medicine

## 2021-10-11 DIAGNOSIS — R748 Abnormal levels of other serum enzymes: Secondary | ICD-10-CM

## 2021-11-04 ENCOUNTER — Other Ambulatory Visit: Payer: Self-pay | Admitting: Family Medicine

## 2021-11-04 DIAGNOSIS — E785 Hyperlipidemia, unspecified: Secondary | ICD-10-CM | POA: Diagnosis not present

## 2021-11-04 DIAGNOSIS — Z0000 Encounter for general adult medical examination without abnormal findings: Secondary | ICD-10-CM | POA: Diagnosis not present

## 2021-11-04 DIAGNOSIS — R739 Hyperglycemia, unspecified: Secondary | ICD-10-CM | POA: Diagnosis not present

## 2021-11-04 DIAGNOSIS — R748 Abnormal levels of other serum enzymes: Secondary | ICD-10-CM | POA: Diagnosis not present

## 2021-11-04 DIAGNOSIS — I10 Essential (primary) hypertension: Secondary | ICD-10-CM | POA: Diagnosis not present

## 2021-11-05 LAB — HEPATIC FUNCTION PANEL
ALT: 80 IU/L — ABNORMAL HIGH (ref 0–44)
AST: 53 IU/L — ABNORMAL HIGH (ref 0–40)
Albumin: 4.9 g/dL (ref 3.8–4.9)
Alkaline Phosphatase: 67 IU/L (ref 44–121)
Bilirubin Total: 0.6 mg/dL (ref 0.0–1.2)
Bilirubin, Direct: 0.22 mg/dL (ref 0.00–0.40)
Total Protein: 6.9 g/dL (ref 6.0–8.5)

## 2021-11-05 LAB — ACUTE VIRAL HEPATITIS (HAV, HBV, HCV)
HCV Ab: NONREACTIVE
Hep A IgM: NEGATIVE
Hep B C IgM: NEGATIVE
Hepatitis B Surface Ag: NEGATIVE

## 2021-11-05 LAB — HCV INTERPRETATION

## 2021-11-06 LAB — COMPREHENSIVE METABOLIC PANEL
ALT: 77 IU/L — ABNORMAL HIGH (ref 0–44)
ALT: 100 IU/L — ABNORMAL HIGH (ref 0–44)
AST: 55 IU/L — ABNORMAL HIGH (ref 0–40)
AST: 64 IU/L — ABNORMAL HIGH (ref 0–40)
Albumin/Globulin Ratio: 2.3 — ABNORMAL HIGH (ref 1.2–2.2)
Albumin/Globulin Ratio: 2.4 — ABNORMAL HIGH (ref 1.2–2.2)
Albumin: 4.9 g/dL (ref 3.8–4.9)
Albumin: 4.8 g/dL (ref 3.8–4.9)
Alkaline Phosphatase: 72 IU/L (ref 44–121)
Alkaline Phosphatase: 81 IU/L (ref 44–121)
BUN/Creatinine Ratio: 18 (ref 9–20)
BUN/Creatinine Ratio: 10 (ref 9–20)
BUN: 17 mg/dL (ref 6–24)
BUN: 11 mg/dL (ref 6–24)
Bilirubin Total: 0.6 mg/dL (ref 0.0–1.2)
Bilirubin Total: 0.4 mg/dL (ref 0.0–1.2)
CO2: 27 mmol/L (ref 20–29)
CO2: 24 mmol/L (ref 20–29)
Calcium: 9.4 mg/dL (ref 8.7–10.2)
Calcium: 9.9 mg/dL (ref 8.7–10.2)
Chloride: 101 mmol/L (ref 96–106)
Chloride: 99 mmol/L (ref 96–106)
Creatinine, Ser: 0.94 mg/dL (ref 0.76–1.27)
Creatinine, Ser: 1.05 mg/dL (ref 0.76–1.27)
Globulin, Total: 2.1 g/dL (ref 1.5–4.5)
Globulin, Total: 2 g/dL (ref 1.5–4.5)
Glucose: 97 mg/dL (ref 70–99)
Glucose: 121 mg/dL — ABNORMAL HIGH (ref 70–99)
Potassium: 4.3 mmol/L (ref 3.5–5.2)
Potassium: 4.3 mmol/L (ref 3.5–5.2)
Sodium: 144 mmol/L (ref 134–144)
Sodium: 142 mmol/L (ref 134–144)
Total Protein: 7 g/dL (ref 6.0–8.5)
Total Protein: 6.8 g/dL (ref 6.0–8.5)
eGFR: 96 mL/min/{1.73_m2} (ref >59)
eGFR: 84 mL/min/{1.73_m2} (ref >59)

## 2021-11-06 LAB — CBC WITH DIFFERENTIAL/PLATELET
Basophils Absolute: 0 10*3/uL (ref 0.0–0.2)
Basos: 1 %
EOS (ABSOLUTE): 0.1 10*3/uL (ref 0.0–0.4)
Eos: 1 %
Hematocrit: 46.5 % (ref 37.5–51.0)
Hemoglobin: 15.9 g/dL (ref 13.0–17.7)
Immature Grans (Abs): 0 10*3/uL (ref 0.0–0.1)
Immature Granulocytes: 0 %
Lymphocytes Absolute: 2 10*3/uL (ref 0.7–3.1)
Lymphs: 33 %
MCH: 29.6 pg (ref 26.6–33.0)
MCHC: 34.2 g/dL (ref 31.5–35.7)
MCV: 87 fL (ref 79–97)
Monocytes Absolute: 0.5 10*3/uL (ref 0.1–0.9)
Monocytes: 8 %
Neutrophils Absolute: 3.3 10*3/uL (ref 1.4–7.0)
Neutrophils: 57 %
Platelets: 214 10*3/uL (ref 150–450)
RBC: 5.37 x10E6/uL (ref 4.14–5.80)
RDW: 12.3 % (ref 11.6–15.4)
WBC: 5.9 10*3/uL (ref 3.4–10.8)

## 2021-11-06 LAB — MICROALBUMIN / CREATININE URINE RATIO
Creatinine, Urine: 148.6 mg/dL
Microalb/Creat Ratio: 5 mg/g creat (ref 0–29)
Microalbumin, Urine: 7.8 ug/mL

## 2021-11-06 LAB — HIV ANTIBODY (ROUTINE TESTING W REFLEX)
HIV Screen 4th Generation wRfx: NONREACTIVE
HIV Screen 4th Generation wRfx: NONREACTIVE

## 2021-11-06 LAB — LIPOPROTEIN ANALYSIS BY NMR
HDL Particle Number: 37.1 umol/L (ref >=30.5)
LDL Particle Number: 401 nmol/L (ref <1000)
LDL Size: 19.7 nm — ABNORMAL LOW (ref >20.5)
LP-IR Score: 60 — ABNORMAL HIGH (ref <=45)
Small LDL Particle Number: 288 nmol/L (ref <=527)

## 2021-11-06 LAB — HEMOGLOBIN A1C
Est. average glucose Bld gHb Est-mCnc: 131 mg/dL
Hgb A1c MFr Bld: 6.2 % — ABNORMAL HIGH (ref 4.8–5.6)

## 2021-11-06 LAB — TSH: TSH: 3.28 u[IU]/mL (ref 0.450–4.500)

## 2021-11-06 LAB — RPR
RPR Ser Ql: NONREACTIVE
RPR Ser Ql: NONREACTIVE

## 2021-11-06 LAB — LIPID PANEL
Chol/HDL Ratio: 2.7 ratio (ref 0.0–5.0)
Cholesterol, Total: 115 mg/dL (ref 100–199)
HDL: 43 mg/dL (ref >39)
LDL Chol Calc (NIH): 38 mg/dL (ref 0–99)
Triglycerides: 216 mg/dL — ABNORMAL HIGH (ref 0–149)
VLDL Cholesterol Cal: 34 mg/dL (ref 5–40)

## 2021-11-06 LAB — VITAMIN B12: Vitamin B-12: 720 pg/mL (ref 232–1245)

## 2021-11-06 LAB — VITAMIN D 25 HYDROXY (VIT D DEFICIENCY, FRACTURES): Vit D, 25-Hydroxy: 46.7 ng/mL (ref 30.0–100.0)

## 2022-03-13 ENCOUNTER — Other Ambulatory Visit: Payer: Self-pay | Admitting: Family Medicine

## 2022-03-13 DIAGNOSIS — I10 Essential (primary) hypertension: Secondary | ICD-10-CM

## 2022-03-16 NOTE — Telephone Encounter (Signed)
Lvm to inform prescription has been sent to pharmacy and for him to return call to schedule follow up appt

## 2022-03-20 ENCOUNTER — Other Ambulatory Visit: Payer: Self-pay | Admitting: Family Medicine

## 2022-03-20 DIAGNOSIS — E785 Hyperlipidemia, unspecified: Secondary | ICD-10-CM

## 2022-03-20 NOTE — Telephone Encounter (Signed)
Requested Prescriptions  Pending Prescriptions Disp Refills   ezetimibe (ZETIA) 10 MG tablet [Pharmacy Med Name: Ezetimibe 10 MG Oral Tablet] 90 tablet 2    Sig: TAKE 1 TABLET BY MOUTH DAILY     Cardiovascular:  Antilipid - Sterol Transport Inhibitors Failed - 03/20/2022  7:40 AM      Failed - AST in normal range and within 360 days    AST  Date Value Ref Range Status  11/04/2021 53 (H) 0 - 40 IU/L Final         Failed - ALT in normal range and within 360 days    ALT  Date Value Ref Range Status  11/04/2021 80 (H) 0 - 44 IU/L Final         Failed - Lipid Panel in normal range within the last 12 months    Cholesterol, Total  Date Value Ref Range Status  10/05/2021 115 100 - 199 mg/dL Final   Cholesterol  Date Value Ref Range Status  10/07/2021 116 0 - 200 Final   LDL Chol Calc (NIH)  Date Value Ref Range Status  10/05/2021 38 0 - 99 mg/dL Final   LDL Cholesterol  Date Value Ref Range Status  10/07/2021 38  Final   HDL  Date Value Ref Range Status  10/07/2021 43 35 - 70 Final  10/05/2021 43 >39 mg/dL Final   Triglycerides  Date Value Ref Range Status  10/07/2021 222 (A) 40 - 160 Final         Passed - Patient is not pregnant      Passed - Valid encounter within last 12 months    Recent Outpatient Visits           5 months ago Hypertension, benign   [facility] Steele Sizer, MD   9 months ago Hypertension, benign   [facility] Steele Sizer, MD   1 year ago Dyslipidemia (high LDL; low HDL)   [facility] Steele Sizer, MD   1 year ago Dyslipidemia (high LDL; low HDL)   [facility] [place], Drue Stager, MD   2 years ago Dyslipidemia (high LDL; low HDL)   [facility] Steele Sizer, MD

## 2022-03-25 NOTE — Progress Notes (Signed)
BP 128/82   Temp 97.9 F (36.6 C) (Oral)   Resp 18   Ht 5' 10.5" (1.791 m)   Wt 222 lb 11.2 oz (101 kg)   SpO2 99%   BMI 31.50 kg/m    Subjective:    Patient ID: Danny Fisher, male    DOB: May 12, 1965, 57 y.o.   MRN: [id number]  HPI: Danny Fisher is a 57 y.o. male  Chief Complaint  Patient presents with   Back Pain    Was putting up christmas tree and strain back on Monday. AS time go on the pain is getting worse   Low back pain:  patient reports he went to put the christmas tree away and his lower back started hurting. He denies any trauma.  He denies any incontinence.  He says the pain spasms.  He says he feels like his back locks up he says the pain did radiate down into his legs.  He says he has taken aleve for the pain and it has helped. Discussed will do prescription aleve for him to take for the next few days and a muscle relaxer robaxin.  Will give him an injection of Toradol and kenalog in the office.   Relevant past medical, surgical, family and social history reviewed and updated as indicated. Interim medical history since our last visit reviewed. Allergies and medications reviewed and updated.  Review of Systems  Constitutional: Negative for fever or weight change.  Respiratory: Negative for cough and shortness of breath.   Cardiovascular: Negative for chest pain or palpitations.  Gastrointestinal: Negative for abdominal pain, no bowel changes.  Musculoskeletal: positive for gait problem or joint swelling. Positive for lower back pain Skin: Negative for rash.  Neurological: Negative for dizziness or headache.  No other specific complaints in a complete review of systems (except as listed in HPI above).      Objective:    BP 128/82   Temp 97.9 F (36.6 C) (Oral)   Resp 18   Ht 5' 10.5" (1.791 m)   Wt 222 lb 11.2 oz (101 kg)   SpO2 99%   BMI 31.50 kg/m   Wt Readings from Last 3 Encounters:  03/26/22 222 lb 11.2 oz (101 kg)  10/07/21 227 lb 8 oz (103.2 kg)   06/05/21 229 lb 11.2 oz (104.2 kg)    Physical Exam  Constitutional: Patient appears well-developed and well-nourished. Obese  No distress.  HEENT: head atraumatic, normocephalic, pupils equal and reactive to light, neck supple Cardiovascular: Normal rate, regular rhythm and normal heart sounds.  No murmur heard. No BLE edema. Pulmonary/Chest: Effort normal and breath sounds normal. No respiratory distress. Abdominal: Soft.  There is no tenderness. MSK: no tenderness in the midline.  Tightness noted in the lower back Psychiatric: Patient has a normal mood and affect. behavior is normal. Judgment and thought content normal.  Results for orders placed or performed in visit on 11/04/21  Lipoprotein Analysis by NMR  Result Value Ref Range   LDL Particle Number 401 <1,000 nmol/L   HDL Particle Number [id number] >=30.5 umol/L   Small LDL Particle Number 288 <=527 nmol/L   LDL Size 19.7 (L) >20.5 nm   LP-IR Score 60 (H) <=45  CBC with Differential/Platelet  Result Value Ref Range   WBC 5.9 3.4 - 10.8 x10E3/uL   RBC 5.37 4.14 - 5.80 x10E6/uL   Hemoglobin 15.9 13.0 - 17.7 g/dL   Hematocrit 46.5 37.5 - 51.0 %   MCV 87 79 - 97 fL  MCH 29.6 26.6 - 33.0 pg   MCHC 34.2 31.5 - 35.7 g/dL   RDW 12.3 11.6 - 15.4 %   Platelets 214 150 - 450 x10E3/uL   Neutrophils 57 Not Estab. %   Lymphs 33 Not Estab. %   Monocytes 8 Not Estab. %   Eos 1 Not Estab. %   Basos 1 Not Estab. %   Neutrophils Absolute 3.3 1.4 - 7.0 x10E3/uL   Lymphocytes Absolute 2.0 0.7 - 3.1 x10E3/uL   Monocytes Absolute 0.5 0.1 - 0.9 x10E3/uL   EOS (ABSOLUTE) 0.1 0.0 - 0.4 x10E3/uL   Basophils Absolute 0.0 0.0 - 0.2 x10E3/uL   Immature Granulocytes 0 Not Estab. %   Immature Grans (Abs) 0.0 0.0 - 0.1 x10E3/uL  Comprehensive metabolic panel  Result Value Ref Range   Glucose 97 70 - 99 mg/dL   BUN 17 6 - 24 mg/dL   Creatinine, Ser 0.94 0.76 - 1.27 mg/dL   eGFR 96 >59 mL/min/1.73   BUN/Creatinine Ratio 18 9 - 20   Sodium 144 134  - 144 mmol/L   Potassium 4.3 3.5 - 5.2 mmol/L   Chloride 101 96 - 106 mmol/L   CO2 27 20 - 29 mmol/L   Calcium 9.4 8.7 - 10.2 mg/dL   Total Protein 7.0 6.0 - 8.5 g/dL   Albumin 4.9 3.8 - 4.9 g/dL   Globulin, Total 2.1 1.5 - 4.5 g/dL   Albumin/Globulin Ratio 2.3 (H) 1.2 - 2.2   Bilirubin Total 0.6 0.0 - 1.2 mg/dL   Alkaline Phosphatase 72 44 - 121 IU/L   AST 55 (H) 0 - 40 IU/L   ALT 77 (H) 0 - 44 IU/L  Microalbumin / creatinine urine ratio  Result Value Ref Range   Creatinine, Urine 148.6 Not Estab. mg/dL   Microalbumin, Urine 7.8 Not Estab. ug/mL   Microalb/Creat Ratio 5 0 - 29 mg/g creat  Hemoglobin A1c  Result Value Ref Range   Hgb A1c MFr Bld 6.2 (H) 4.8 - 5.6 %   Est. average glucose Bld gHb Est-mCnc 131 mg/dL  TSH  Result Value Ref Range   TSH 3.280 0.450 - 4.500 uIU/mL  RPR  Result Value Ref Range   RPR Ser Ql Non Reactive Non Reactive  VITAMIN D 25 Hydroxy (Vit-D Deficiency, Fractures)  Result Value Ref Range   Vit D, 25-Hydroxy 46.7 30.0 - 100.0 ng/mL  HIV Antibody (routine testing w rflx)  Result Value Ref Range   HIV Screen 4th Generation wRfx Non Reactive Non Reactive  Vitamin B12  Result Value Ref Range   Vitamin B-12 720 232 - 1,245 pg/mL      Assessment & Plan:   Problem List Items Addressed This Visit   None Visit Diagnoses     Acute bilateral low back pain without sciatica    -  Primary   muscle spasms, recommend using moist heat, taking naproxen and robaxin, can do back exercises.  included on your mychart AVS.   Relevant Medications   methocarbamol (ROBAXIN) 500 MG tablet   naproxen (NAPROSYN) 500 MG tablet   ketorolac (TORADOL) injection 60 mg (Start on 03/26/2022 12:00 PM)   triamcinolone acetonide (KENALOG-40) injection 40 mg (Start on 03/26/2022 12:00 PM)        Follow up plan: Return if symptoms worsen or fail to improve.

## 2022-03-26 ENCOUNTER — Encounter: Payer: Self-pay | Admitting: Nurse Practitioner

## 2022-03-26 ENCOUNTER — Ambulatory Visit: Payer: BC Managed Care – PPO | Admitting: Nurse Practitioner

## 2022-03-26 VITALS — BP 128/82 | Temp 97.9°F | Resp 18 | Ht 70.5 in | Wt 222.7 lb

## 2022-03-26 DIAGNOSIS — M545 Low back pain, unspecified: Secondary | ICD-10-CM | POA: Diagnosis not present

## 2022-03-26 MED ORDER — KETOROLAC TROMETHAMINE 60 MG/2ML IM SOLN
60.0000 mg | Freq: Once | INTRAMUSCULAR | Status: AC
  Administered 2022-03-26: 60 mg via INTRAMUSCULAR

## 2022-03-26 MED ORDER — TRIAMCINOLONE ACETONIDE 40 MG/ML IJ SUSP
40.0000 mg | Freq: Once | INTRAMUSCULAR | Status: AC
  Administered 2022-03-26: 40 mg via INTRAMUSCULAR

## 2022-03-26 MED ORDER — METHOCARBAMOL 500 MG PO TABS: 500.0000 mg | ORAL_TABLET | Freq: Two times a day (BID) | ORAL | 0 refills | Status: DC | PRN

## 2022-03-26 MED ORDER — NAPROXEN 500 MG PO TABS: 500.0000 mg | ORAL_TABLET | Freq: Two times a day (BID) | ORAL | 0 refills | Status: DC

## 2022-04-07 ENCOUNTER — Other Ambulatory Visit: Payer: Self-pay | Admitting: Nurse Practitioner

## 2022-04-07 DIAGNOSIS — M545 Low back pain, unspecified: Secondary | ICD-10-CM

## 2022-04-07 NOTE — Telephone Encounter (Signed)
Requested Prescriptions  Pending Prescriptions Disp Refills   naproxen (NAPROSYN) 500 MG tablet [Pharmacy Med Name: NAPROXEN '500MG'$  TABLETS] 60 tablet 0    Sig: TAKE 1 TABLET(500 MG) BY MOUTH TWICE DAILY WITH A MEAL     Analgesics:  NSAIDS Failed - 04/07/2022  3:22 AM      Failed - Manual Review: Labs are only required if the patient has taken medication for more than 8 weeks.      Passed - Cr in normal range and within 360 days    Creatinine, Ser  Date Value Ref Range Status  11/04/2021 0.94 0.76 - 1.27 mg/dL Final         Passed - HGB in normal range and within 360 days    Hemoglobin  Date Value Ref Range Status  11/04/2021 15.9 13.0 - 17.7 g/dL Final         Passed - PLT in normal range and within 360 days    Platelets  Date Value Ref Range Status  11/04/2021 214 150 - 450 x10E3/uL Final         Passed - HCT in normal range and within 360 days    Hematocrit  Date Value Ref Range Status  11/04/2021 46.5 37.5 - 51.0 % Final         Passed - eGFR is 30 or above and within 360 days    GFR calc Af Amer  Date Value Ref Range Status  03/26/2020 103 >59 mL/min/1.73 Final    Comment:    **In accordance with recommendations from the NKF-ASN Task force,**   [facility] is in the process of updating its eGFR calculation to the   2021 CKD-EPI creatinine equation that estimates kidney function   without a race variable.    GFR calc non Af Amer  Date Value Ref Range Status  03/26/2020 89 >59 mL/min/1.73 Final   eGFR  Date Value Ref Range Status  11/04/2021 96 >59 mL/min/1.73 Final         Passed - Patient is not pregnant      Passed - Valid encounter within last 12 months    Recent Outpatient Visits           1 week ago Acute bilateral low back pain without sciatica   [facility] Bo Merino, FNP   6 months ago Hypertension, benign   [facility] Steele Sizer, MD   10 months ago Hypertension, benign   [facility] Sizer, MD   1 year ago Dyslipidemia (high LDL; low HDL)   [facility] Steele Sizer, MD   1 year ago Dyslipidemia (high LDL; low HDL)   [facility] Steele Sizer, MD

## 2022-04-21 ENCOUNTER — Other Ambulatory Visit: Payer: Self-pay | Admitting: Family Medicine

## 2022-04-21 DIAGNOSIS — Z9189 Other specified personal risk factors, not elsewhere classified: Secondary | ICD-10-CM

## 2022-04-22 ENCOUNTER — Other Ambulatory Visit: Payer: Self-pay

## 2022-04-22 ENCOUNTER — Encounter: Payer: Self-pay | Admitting: Family Medicine

## 2022-04-22 DIAGNOSIS — Z9189 Other specified personal risk factors, not elsewhere classified: Secondary | ICD-10-CM

## 2022-04-22 NOTE — Telephone Encounter (Signed)
Have tried to call the patient  and left a message 2 times on all numbers to schedule appt for this

## 2022-04-22 NOTE — Telephone Encounter (Signed)
Per Dr Ancil Boozer patient needs an appt for followup so that he will be able to get his meds refill. [facility] can see him today 04-22-2022 or tomorrow 04-23-2022 if any available.

## 2022-04-23 MED ORDER — EMTRICITABINE-TENOFOVIR DF 200-300 MG PO TABS: 1.0000 | ORAL_TABLET | Freq: Every day | ORAL | 0 refills | Status: DC

## 2022-04-30 NOTE — Progress Notes (Unsigned)
Name: Danny Fisher   MRN: [id number]    DOB: 04-21-1965   Date:04/30/2022       Progress Note  Subjective  Chief Complaint  Follow Up  HPI  Dyslipidemia: he denies significant history of heart disease , but mother has dyslipidemia and takes cholesterol medication and grandmother had a stroke in her 65's. He has been taking Crestor and Zetia now, he recently had biometric screen test done and LDL is at goal     AR: symptoms are worse in the Spring. He uses nasal spray and zyxal daily to help with symptoms. Symptoms stable    GAD: he continues to pick on his nails but not as much as he used to prior to starting on Duloxetine. He has been smoking marijuana to help him sleep.  He does not worry constantly like he use to. He is stable on medication    Homosexual: married, but open relationship, on Truvada and denies side effects. He had HIV and RPR done on Monday but results are pending   HTN: his bp was above 140 for multiple visits, we changed from Losartan to valsartan hctz Spring 2023. Today his bp is at [facility], by his heart rate is elevated. He was unable to give urine for microalbumin/creatinine ration. He denies chest pain, palpitation, dizziness, thyroid pain, SOB or fatigue. He is feeling fine.   Patient Active Problem List   Diagnosis Date Noted   Dyslipidemia 11/22/2017   Tympanic membrane rupture, left 11/22/2017   Obesity (BMI 30.0-34.9) 11/22/2017   At risk for HIV due to homosexual contact 11/22/2017   ADD (attention deficit disorder) 11/22/2017    Past Surgical History:  Procedure Laterality Date    implant first metatarsophalangeal joint right foot Right 12/07/2019   Dr. Doreatha Lew HERNIA REPAIR  1971    Family History  Problem Relation Age of Onset   Hyperlipidemia Mother    Ovarian cancer Maternal Grandmother    Emphysema Maternal Grandfather        Tobacco User   Stroke Paternal Grandmother     Social History   Tobacco Use   Smoking status: Former     Packs/day: 0.50    Years: 25.00    Total pack years: 12.50    Types: Cigarettes    Start date: 03/22/1984    Quit date: 11/22/2009    Years since quitting: 12.4   Smokeless tobacco: Never  Substance Use Topics   Alcohol use: Yes    Comment: very seldom      Current Outpatient Medications:    atenolol (TENORMIN) 25 MG tablet, Take 1 tablet (25 mg total) by mouth daily., Disp: 90 tablet, Rfl: 3   azelastine (ASTELIN) 0.1 % nasal spray, Place 1 spray into both nostrils 2 (two) times daily., Disp: 90 mL, Rfl: 1   diphenhydrAMINE (BENADRYL ALLERGY) 25 MG tablet, Take 1 tablet (25 mg total) by mouth every 6 (six) hours as needed., Disp: 30 tablet, Rfl: 0   DULoxetine (CYMBALTA) 60 MG capsule, Take 1 capsule (60 mg total) by mouth daily., Disp: 90 capsule, Rfl: 1   emtricitabine-tenofovir (TRUVADA) 200-300 MG tablet, Take 1 tablet by mouth daily., Disp: 90 tablet, Rfl: 0   ezetimibe (ZETIA) 10 MG tablet, TAKE 1 TABLET BY MOUTH DAILY, Disp: 90 tablet, Rfl: 2   levocetirizine (XYZAL) 5 MG tablet, Take 1 tablet (5 mg total) by mouth every evening., Disp: 90 tablet, Rfl: 1   methocarbamol (ROBAXIN) 500 MG tablet, Take 1 tablet (  500 mg total) by mouth 2 (two) times daily as needed for muscle spasms., Disp: 20 tablet, Rfl: 0   Multiple Vitamins-Minerals (MULTIVITAMIN WITH MINERALS) tablet, Take 1 tablet by mouth daily., Disp: 30 tablet, Rfl: 0   naproxen (NAPROSYN) 500 MG tablet, TAKE 1 TABLET(500 MG) BY MOUTH TWICE DAILY WITH A MEAL, Disp: 60 tablet, Rfl: 0   rosuvastatin (CRESTOR) 40 MG tablet, Take 1 tablet (40 mg total) by mouth daily., Disp: 90 tablet, Rfl: 1   valsartan-hydrochlorothiazide (DIOVAN-HCT) 160-12.5 MG tablet, TAKE 1 TABLET BY MOUTH DAILY IN  PLACE OF LOSARTAN, Disp: 90 tablet, Rfl: 0  No Known Allergies  I personally reviewed active problem list, medication list, allergies, family history, social history, health maintenance with the patient/caregiver  today.   ROS  ***  Objective  There were no vitals filed for this visit.  There is no height or weight on file to calculate BMI.  Physical Exam ***  No results found for this or any previous visit (from the past 2160 hour(s)).   PHQ2/9:    03/26/2022   11:38 AM 10/07/2021    3:42 PM 06/05/2021    2:54 PM 12/05/2020    3:13 PM 07/29/2020   10:35 AM  Depression screen PHQ 2/9  Decreased Interest 0 0 0 0 0  Down, Depressed, Hopeless 0 0 0 0 0  PHQ - 2 Score 0 0 0 0 0  Altered sleeping  1 0  0  Tired, decreased energy  1 0  0  Change in appetite  0 0  0  Feeling bad or failure about yourself   0 0  0  Trouble concentrating  0 0  0  Moving slowly or fidgety/restless  0 0  0  Suicidal thoughts  0 0  0  PHQ-9 Score  2 0  0  Difficult doing work/chores  Not difficult at all Not difficult at all  Not difficult at all    phq 9 is {gen pos [id number]   Fall Risk:    03/26/2022   11:36 AM 10/07/2021    3:25 PM 06/05/2021    2:54 PM 12/05/2020    3:13 PM 07/29/2020   10:26 AM  Fall Risk   Falls in the past year? 0 0 0 0 0  Number falls in past yr: 0  0 0 0  Injury with Fall? 0  0 0 0  Risk for fall due to :  No Fall Risks No Fall Risks No Fall Risks   Follow up Falls evaluation completed Falls prevention discussed Falls prevention discussed Falls prevention discussed       Functional Status Survey:      Assessment & Plan  *** There are no diagnoses linked to this encounter.

## 2022-05-03 ENCOUNTER — Encounter: Payer: Self-pay | Admitting: Family Medicine

## 2022-05-03 ENCOUNTER — Ambulatory Visit: Payer: BC Managed Care – PPO | Admitting: Family Medicine

## 2022-05-03 VITALS — BP 144/76 | HR 95 | Resp 16 | Ht 71.0 in | Wt 213.0 lb

## 2022-05-03 DIAGNOSIS — J3089 Other allergic rhinitis: Secondary | ICD-10-CM

## 2022-05-03 DIAGNOSIS — R252 Cramp and spasm: Secondary | ICD-10-CM | POA: Diagnosis not present

## 2022-05-03 DIAGNOSIS — E785 Hyperlipidemia, unspecified: Secondary | ICD-10-CM

## 2022-05-03 DIAGNOSIS — J302 Other seasonal allergic rhinitis: Secondary | ICD-10-CM | POA: Insufficient documentation

## 2022-05-03 DIAGNOSIS — R748 Abnormal levels of other serum enzymes: Secondary | ICD-10-CM

## 2022-05-03 DIAGNOSIS — R739 Hyperglycemia, unspecified: Secondary | ICD-10-CM

## 2022-05-03 DIAGNOSIS — Z9189 Other specified personal risk factors, not elsewhere classified: Secondary | ICD-10-CM

## 2022-05-03 DIAGNOSIS — F411 Generalized anxiety disorder: Secondary | ICD-10-CM

## 2022-05-03 DIAGNOSIS — I10 Essential (primary) hypertension: Secondary | ICD-10-CM | POA: Insufficient documentation

## 2022-05-03 DIAGNOSIS — Z114 Encounter for screening for human immunodeficiency virus [HIV]: Secondary | ICD-10-CM

## 2022-05-03 LAB — POCT GLYCOSYLATED HEMOGLOBIN (HGB A1C): Hemoglobin A1C: 6 % — AB (ref 4.0–5.6)

## 2022-05-03 MED ORDER — LEVOCETIRIZINE DIHYDROCHLORIDE 5 MG PO TABS: 5.0000 mg | ORAL_TABLET | Freq: Every evening | ORAL | 1 refills | Status: DC

## 2022-05-03 MED ORDER — ROSUVASTATIN CALCIUM 40 MG PO TABS: 40.0000 mg | ORAL_TABLET | Freq: Every day | ORAL | 1 refills | Status: DC

## 2022-05-03 MED ORDER — DULOXETINE HCL 60 MG PO CPEP: 60.0000 mg | ORAL_CAPSULE | Freq: Every day | ORAL | 1 refills | Status: DC

## 2022-05-03 MED ORDER — EMTRICITABINE-TENOFOVIR DF 200-300 MG PO TABS: 1.0000 | ORAL_TABLET | Freq: Every day | ORAL | 1 refills | Status: DC

## 2022-05-03 NOTE — Addendum Note (Signed)
Addended by: Carlene Coria on: 05/03/2022 01:46 PM   Modules accepted: Orders

## 2022-05-03 NOTE — Patient Instructions (Signed)
Please check what medication you are out and if it is ATenolol we will continue same bp medications otherwise I will go up on valsartan hctz dose

## 2022-05-07 ENCOUNTER — Ambulatory Visit
Admission: RE | Admit: 2022-05-07 | Discharge: 2022-05-07 | Disposition: A | Discharge status: Home / Self Care | Payer: BC Managed Care – PPO | Source: Ambulatory Visit | Attending: Family Medicine | Admitting: Family Medicine

## 2022-05-07 ENCOUNTER — Other Ambulatory Visit: Payer: Self-pay | Admitting: Internal Medicine

## 2022-05-07 DIAGNOSIS — R748 Abnormal levels of other serum enzymes: Secondary | ICD-10-CM | POA: Diagnosis not present

## 2022-05-07 DIAGNOSIS — R739 Hyperglycemia, unspecified: Secondary | ICD-10-CM | POA: Insufficient documentation

## 2022-05-07 DIAGNOSIS — M545 Low back pain, unspecified: Secondary | ICD-10-CM

## 2022-05-07 DIAGNOSIS — K824 Cholesterolosis of gallbladder: Secondary | ICD-10-CM | POA: Diagnosis not present

## 2022-05-07 DIAGNOSIS — R945 Abnormal results of liver function studies: Secondary | ICD-10-CM | POA: Diagnosis not present

## 2022-05-07 NOTE — Telephone Encounter (Signed)
Unable to refill per protocol, Rx expired. Discontinued 05/03/22, completed dose.  Requested Prescriptions  Pending Prescriptions Disp Refills   naproxen (NAPROSYN) 500 MG tablet [Pharmacy Med Name: NAPROXEN 500MG TABLETS] 60 tablet 0    Sig: TAKE 1 TABLET(500 MG) BY MOUTH TWICE DAILY WITH A MEAL     Analgesics:  NSAIDS Failed - 05/07/2022  3:20 AM      Failed - Manual Review: Labs are only required if the patient has taken medication for more than 8 weeks.      Passed - Cr in normal range and within 360 days    Creatinine, Ser  Date Value Ref Range Status  11/04/2021 0.94 0.76 - 1.27 mg/dL Final         Passed - HGB in normal range and within 360 days    Hemoglobin  Date Value Ref Range Status  11/04/2021 15.9 13.0 - 17.7 g/dL Final         Passed - PLT in normal range and within 360 days    Platelets  Date Value Ref Range Status  11/04/2021 214 150 - 450 x10E3/uL Final         Passed - HCT in normal range and within 360 days    Hematocrit  Date Value Ref Range Status  11/04/2021 46.5 37.5 - 51.0 % Final         Passed - eGFR is 30 or above and within 360 days    GFR calc Af Amer  Date Value Ref Range Status  03/26/2020 103 >59 mL/min/1.73 Final    Comment:    **In accordance with recommendations from the NKF-ASN Task force,**   [facility] is in the process of updating its eGFR calculation to the   2021 CKD-EPI creatinine equation that estimates kidney function   without a race variable.    GFR calc non Af Amer  Date Value Ref Range Status  03/26/2020 89 >59 mL/min/1.73 Final   eGFR  Date Value Ref Range Status  11/04/2021 96 >59 mL/min/1.73 Final         Passed - Patient is not pregnant      Passed - Valid encounter within last 12 months    Recent Outpatient Visits           4 days ago Elevated liver enzymes   [facility], Drue Stager, MD   1 month ago Acute bilateral low back pain without sciatica   [facility] Bo Merino, FNP   7 months ago Hypertension, benign   [facility] Steele Sizer, MD   11 months ago Hypertension, benign   [facility] Steele Sizer, MD   1 year ago Dyslipidemia (high LDL; low HDL)   [facility] Steele Sizer, MD       Future Appointments             In 1 month Ancil Boozer, Drue Stager, MD [facility], [place]   In 5 months Steele Sizer, MD [facility], [facility]

## 2022-05-10 ENCOUNTER — Other Ambulatory Visit: Payer: Self-pay | Admitting: Family Medicine

## 2022-05-10 ENCOUNTER — Encounter: Payer: Self-pay | Admitting: Family Medicine

## 2022-05-10 MED ORDER — METHOCARBAMOL 750 MG PO TABS: 750.0000 mg | ORAL_TABLET | Freq: Four times a day (QID) | ORAL | 0 refills | Status: DC

## 2022-05-11 IMAGING — MR MR FOOT*R* W/O CM
4 of 5 series · 18 of 40 positions shown · non-contrast
Comparison: None.

CLINICAL DATA: Right great toe surgery 12/07/2019. [DATE] toe is
curling under the big toe for 3 months.

EXAM:
MRI OF THE RIGHT FOREFOOT WITHOUT CONTRAST
TECHNIQUE: Multiplanar, multisequence MR imaging of the right great toe was
performed. No intravenous contrast was administered.

[Series 4: T1 · coronal · 3.0mm · 0.19mm/px · 3 of 44 slices shown (1 of 2)]
[im 5/44]
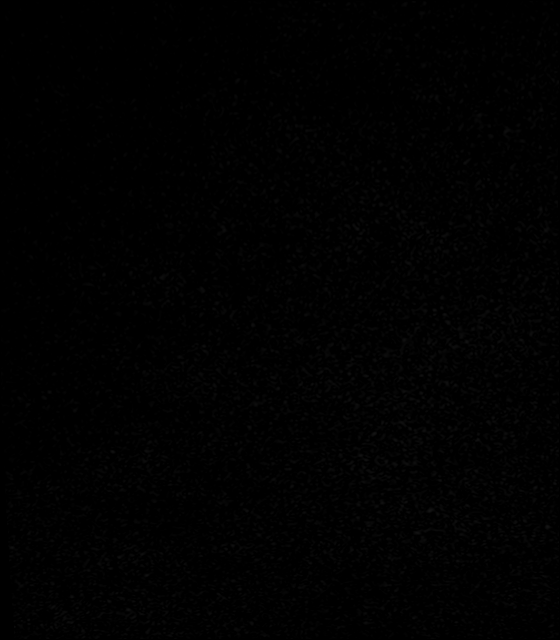
[im 22/44]
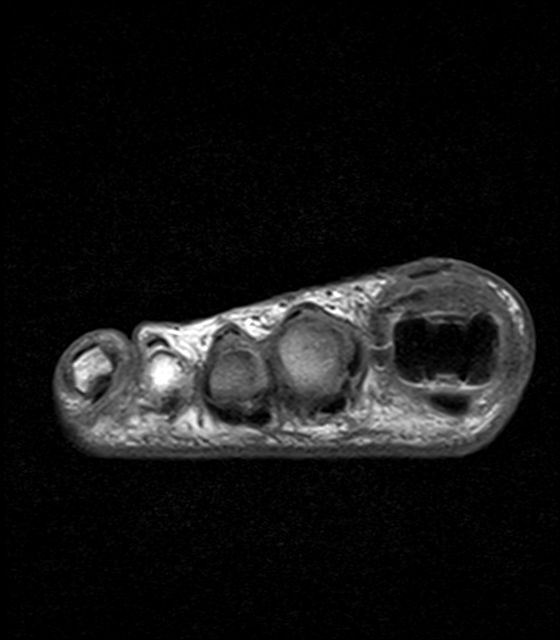
[im 39/44]
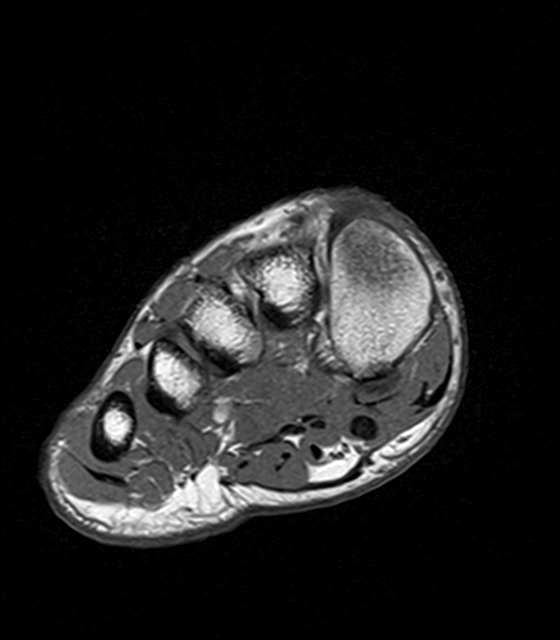

[Series 5: T2 fat-sat · coronal · 3.0mm · 0.19mm/px · 9 of 44 slices shown (1 of 2)]
[im 1/44]
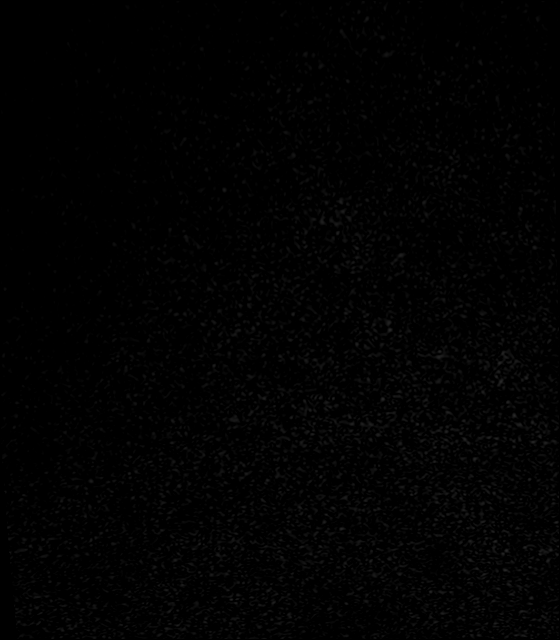
[im 5/44]
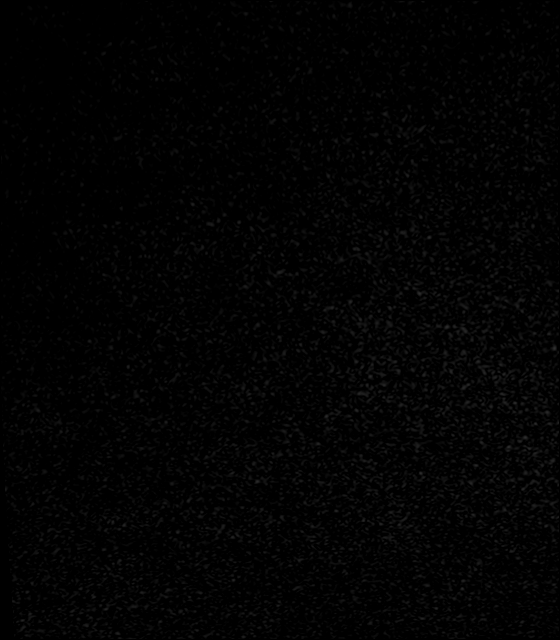
[im 9/44]
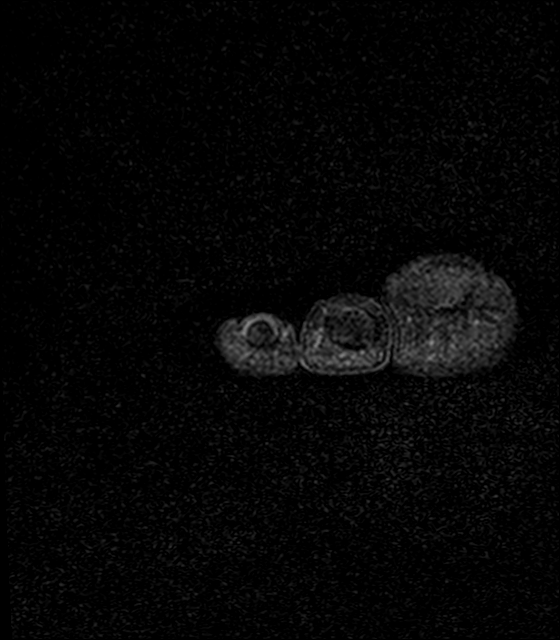
[im 13/44]
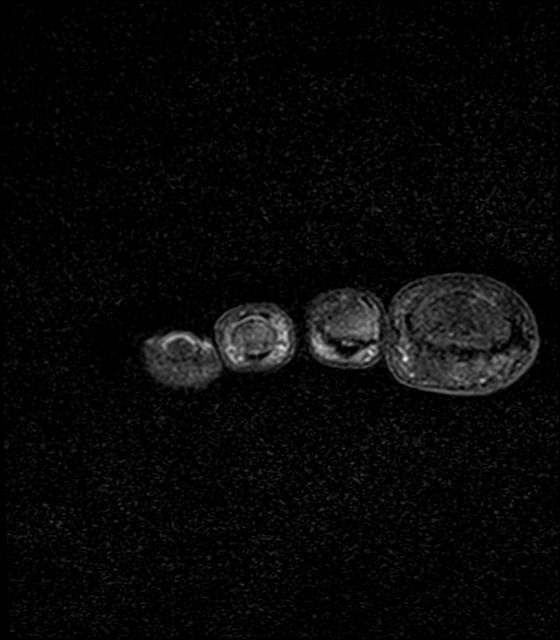
[im 18/44]
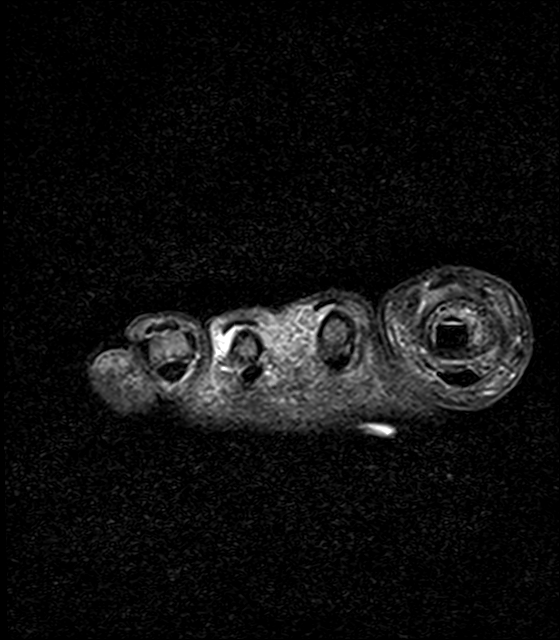
[im 22/44]
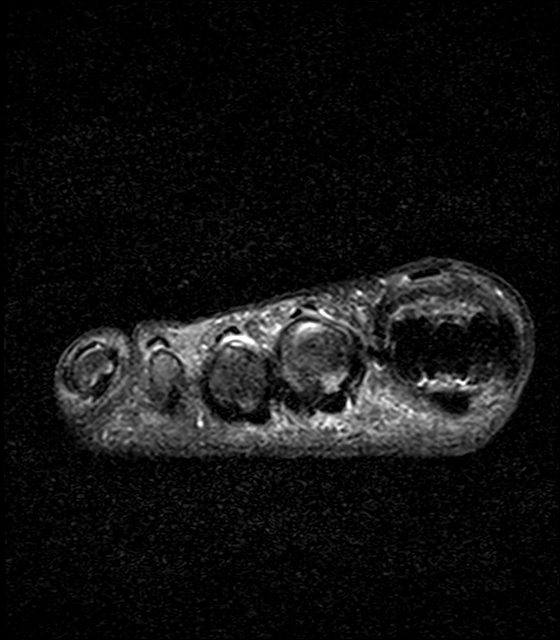
[im 26/44]
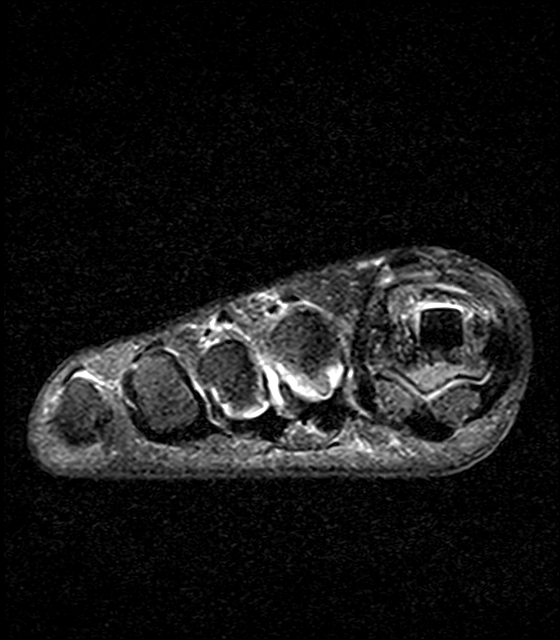
[im 31/44]
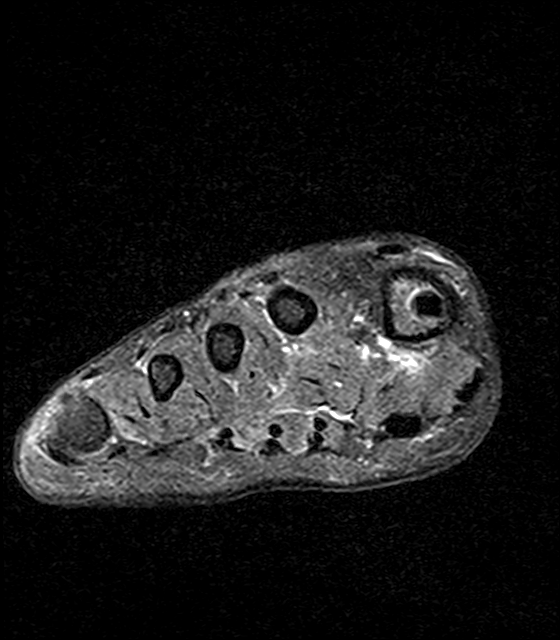
[im 39/44]
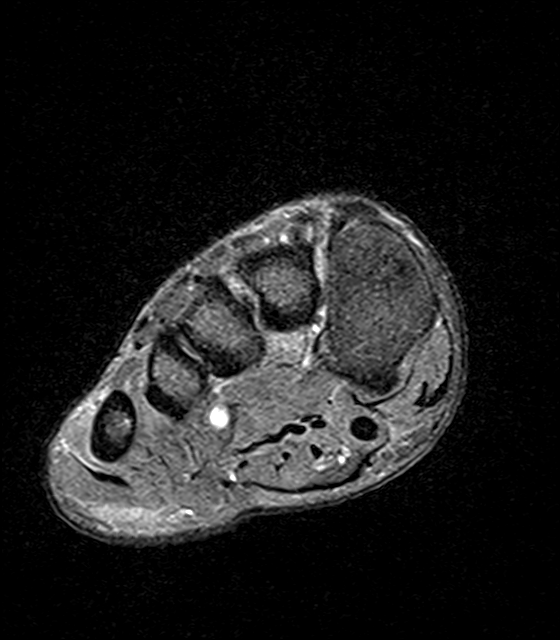

[Series 6: T2 fat-sat · axial · 3.0mm · 0.35mm/px · z∈[-130,-51]mm · 3 of 22 slices shown (2 of 2)]
[im 1/22]
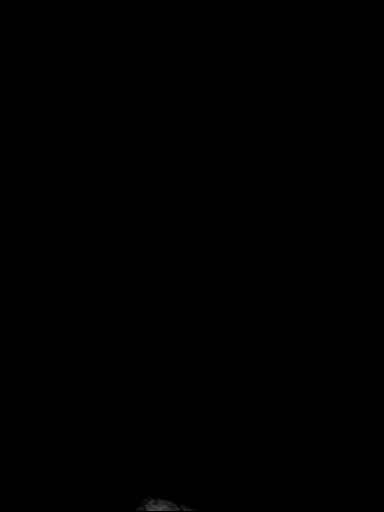
[im 11/22]
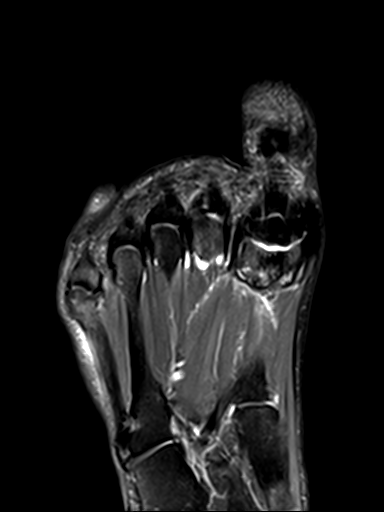
[im 22/22]
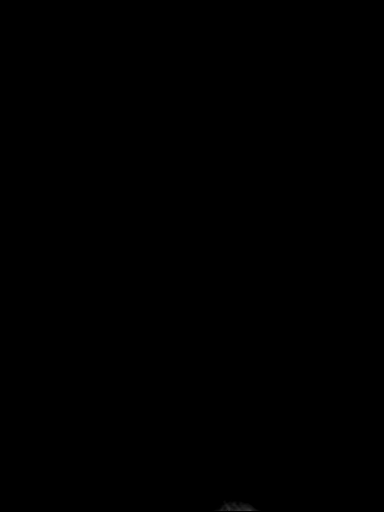

[Series 7: T1 · axial · 3.0mm · 0.35mm/px · z∈[-130,-51]mm · 3 of 22 slices shown (2 of 2)]
[im 1/22]
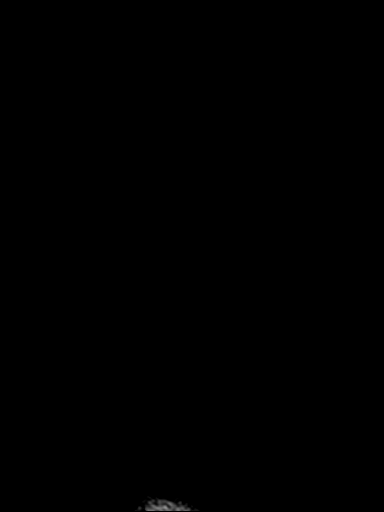
[im 11/22]
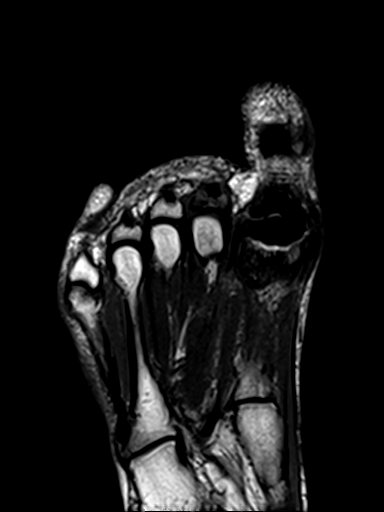
[im 22/22]
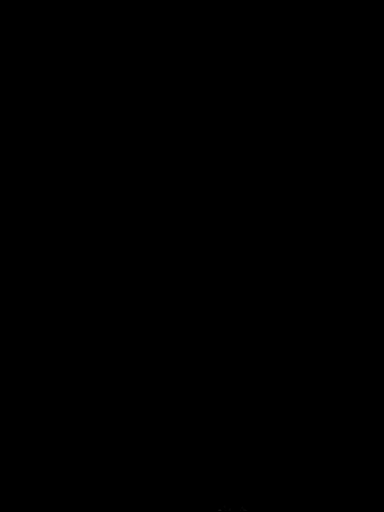

[18 of 40 positions shown; findings below may reference images not displayed]

FINDINGS: Bones/Joint/Cartilage

First MTP arthroplasty without periarticular fluid collection or
osteolysis. Minimal T2 hyperintense signal around the stem
components likely postsurgical given recent surgery.

No fracture or dislocation. Normal alignment. No joint effusion.
Joint capsules are normal.

Ligaments

Collateral ligaments are intact.  Lisfranc ligament is intact.

Muscles and Tendons
Flexor, peroneal and extensor compartment tendons are intact.
Muscles are normal.

Soft tissue
No fluid collection or hematoma.  No soft tissue mass.
IMPRESSION: First MTP arthroplasty without periarticular fluid collection or
osteolysis. Minimal T2 hyperintense signal around the stem
components likely postsurgical given recent surgery.

Otherwise no acute abnormality of the right forefoot.

## 2022-05-20 ENCOUNTER — Other Ambulatory Visit: Payer: Self-pay | Admitting: Family Medicine

## 2022-05-20 DIAGNOSIS — I10 Essential (primary) hypertension: Secondary | ICD-10-CM

## 2022-05-28 ENCOUNTER — Other Ambulatory Visit: Payer: Self-pay | Admitting: Family Medicine

## 2022-05-28 DIAGNOSIS — J302 Other seasonal allergic rhinitis: Secondary | ICD-10-CM

## 2022-06-04 DIAGNOSIS — Z114 Encounter for screening for human immunodeficiency virus [HIV]: Secondary | ICD-10-CM | POA: Diagnosis not present

## 2022-06-04 DIAGNOSIS — R748 Abnormal levels of other serum enzymes: Secondary | ICD-10-CM | POA: Diagnosis not present

## 2022-06-04 DIAGNOSIS — R739 Hyperglycemia, unspecified: Secondary | ICD-10-CM | POA: Diagnosis not present

## 2022-06-04 DIAGNOSIS — R252 Cramp and spasm: Secondary | ICD-10-CM | POA: Diagnosis not present

## 2022-06-05 LAB — CBC WITH DIFFERENTIAL/PLATELET
Basophils Absolute: 0 10*3/uL (ref 0.0–0.2)
Basos: 0 %
EOS (ABSOLUTE): 0.1 10*3/uL (ref 0.0–0.4)
Eos: 2 %
Hematocrit: 45.8 % (ref 37.5–51.0)
Hemoglobin: 15.3 g/dL (ref 13.0–17.7)
Immature Grans (Abs): 0 10*3/uL (ref 0.0–0.1)
Immature Granulocytes: 0 %
Lymphocytes Absolute: 2.4 10*3/uL (ref 0.7–3.1)
Lymphs: 36 %
MCH: 29.4 pg (ref 26.6–33.0)
MCHC: 33.4 g/dL (ref 31.5–35.7)
MCV: 88 fL (ref 79–97)
Monocytes Absolute: 0.7 10*3/uL (ref 0.1–0.9)
Monocytes: 10 %
Neutrophils Absolute: 3.5 10*3/uL (ref 1.4–7.0)
Neutrophils: 52 %
Platelets: 185 10*3/uL (ref 150–450)
RBC: 5.2 x10E6/uL (ref 4.14–5.80)
RDW: 14.2 % (ref 11.6–15.4)
WBC: 6.7 10*3/uL (ref 3.4–10.8)

## 2022-06-05 LAB — COMPREHENSIVE METABOLIC PANEL
ALT: 37 IU/L (ref 0–44)
AST: 26 IU/L (ref 0–40)
Albumin/Globulin Ratio: 2.3 — ABNORMAL HIGH (ref 1.2–2.2)
Albumin: 4.6 g/dL (ref 3.8–4.9)
Alkaline Phosphatase: 70 IU/L (ref 44–121)
BUN/Creatinine Ratio: 22 — ABNORMAL HIGH (ref 9–20)
BUN: 22 mg/dL (ref 6–24)
Bilirubin Total: 0.4 mg/dL (ref 0.0–1.2)
CO2: 24 mmol/L (ref 20–29)
Calcium: 9.4 mg/dL (ref 8.7–10.2)
Chloride: 104 mmol/L (ref 96–106)
Creatinine, Ser: 1 mg/dL (ref 0.76–1.27)
Globulin, Total: 2 g/dL (ref 1.5–4.5)
Glucose: 89 mg/dL (ref 70–99)
Potassium: 4.8 mmol/L (ref 3.5–5.2)
Sodium: 145 mmol/L — ABNORMAL HIGH (ref 134–144)
Total Protein: 6.6 g/dL (ref 6.0–8.5)
eGFR: 88 mL/min/{1.73_m2} (ref >59)

## 2022-06-05 LAB — FERRITIN: Ferritin: 56 ng/mL (ref 30–400)

## 2022-06-05 LAB — HIV ANTIBODY (ROUTINE TESTING W REFLEX): HIV Screen 4th Generation wRfx: NONREACTIVE

## 2022-06-05 LAB — MAGNESIUM: Magnesium: 2.3 mg/dL (ref 1.6–2.3)

## 2022-06-07 NOTE — Patient Instructions (Signed)

## 2022-06-07 NOTE — Progress Notes (Unsigned)
Name: Danny Fisher   MRN: [id number]    DOB: [birth year]   Date:06/08/2022       Progress Note  Subjective  Chief Complaint  Annual Exam  HPI  Patient presents for annual CPE.  IPSS Questionnaire (AUA-7): Over the past month.   1)  How often have you had a sensation of not emptying your bladder completely after you finish urinating?  0 - Not at all  2)  How often have you had to urinate again less than two hours after you finished urinating? 0 - Not at all  3)  How often have you found you stopped and started again several times when you urinated?  0 - Not at all  4) How difficult have you found it to postpone urination?  0 - Not at all  5) How often have you had a weak urinary stream?  0 - Not at all  6) How often have you had to push or strain to begin urination?  0 - Not at all  7) How many times did you most typically get up to urinate from the time you went to bed until the time you got up in the morning?  1 - 1 time  Total score:  0-7 mildly symptomatic   8-19 moderately symptomatic   20-35 severely symptomatic     Diet: low carb  Exercise: he has not been as active since his episode of flu, but he is going to resume activity now  Last Dental Exam: up to date  Last Eye Exam: he will call back   Depression: phq 9 is negative    06/08/2022    2:23 PM 05/03/2022    1:14 PM 03/26/2022   11:38 AM 10/07/2021    3:42 PM 06/05/2021    2:54 PM  Depression screen PHQ 2/9  Decreased Interest 0 0 0 0 0  Down, Depressed, Hopeless 0 0 0 0 0  PHQ - 2 Score 0 0 0 0 0  Altered sleeping 0 0  1 0  Tired, decreased energy 0 0  1 0  Change in appetite 0 0  0 0  Feeling bad or failure about yourself  0 0  0 0  Trouble concentrating 0 0  0 0  Moving slowly or fidgety/restless 0 0  0 0  Suicidal thoughts 0 0  0 0  PHQ-9 Score 0 0  2 0  Difficult doing work/chores    Not difficult at all Not difficult at all    Hypertension:  BP Readings from Last 3 Encounters:  06/08/22 120/68  05/03/22  (!) 144/76  03/26/22 128/82    Obesity: Wt Readings from Last 3 Encounters:  06/08/22 222 lb (100.7 kg)  05/03/22 213 lb (96.6 kg)  03/26/22 222 lb 11.2 oz (101 kg)   BMI Readings from Last 3 Encounters:  06/08/22 30.96 kg/m  05/03/22 29.71 kg/m  03/26/22 31.50 kg/m     Lipids:  Lab Results  Component Value Date   CHOL 116 10/07/2021   CHOL 115 10/05/2021   CHOL 315 (A) 10/22/2017   Lab Results  Component Value Date   HDL 43 10/07/2021   HDL 43 10/05/2021   HDL 42 10/22/2017   Lab Results  Component Value Date   LDLCALC 38 10/07/2021   LDLCALC 38 10/05/2021   LDLCALC 228 10/22/2017   Lab Results  Component Value Date   TRIG 222 (A) 10/07/2021   TRIG 216 (H) 10/05/2021   TRIG 223 (A)  10/22/2017   Lab Results  Component Value Date   CHOLHDL 2.7 10/05/2021   No results found for: "LDLDIRECT" Glucose:  Glucose  Date Value Ref Range Status  06/04/2022 89 70 - 99 mg/dL Final  11/04/2021 97 70 - 99 mg/dL Final  10/05/2021 121 (H) 70 - 99 mg/dL Final    Flowsheet Row [facility] Visit from 03/[facility]/2022 in [facility]  AUDIT-C Score 0       Married STD testing and prevention (HIV/chl/gon/syphilis): 06/04/22 Sexual history: one partner, no sexual problems , using Truvada and no side effects since partner has another partner  Hep C Screening: 11/04/21 Skin cancer: Discussed monitoring for atypical lesions Colorectal cancer: 02/09/21 Prostate cancer:  discussed USPTF   Lung cancer:  Low Dose CT Chest recommended if Age 71-80 years, 30 pack-year currently smoking OR have quit w/in 15years. Patient  no a candidate for screening   AAA: The USPSTF recommends one-time screening with ultrasonography in men ages 25 to 33 years who have ever smoked. Patient   no, a candidate for screening  ECG:  11/22/17  Vaccines:   Tdap: up to date Shingrix: up to date Pneumonia: N/A Flu: 2022 - he will get it again this Fall  COVID-19: up to  date  Advanced Care Planning: A voluntary discussion about advance care planning including the explanation and discussion of advance directives.  Discussed health care proxy and Living will, and the patient was able to identify a health care proxy as Danny Fisher .  Patient does not have a living will and power of attorney of health care   Patient Active Problem List   Diagnosis Date Noted   Leg cramps 05/03/2022   Hypertension, benign 05/03/2022   Perennial allergic rhinitis with seasonal variation 05/03/2022   Elevated liver enzymes 05/03/2022   Dyslipidemia 11/22/2017   Tympanic membrane rupture, left 11/22/2017   Obesity (BMI 30.0-34.9) 11/22/2017   At risk for HIV due to homosexual contact 11/22/2017   ADD (attention deficit disorder) 11/22/2017    Past Surgical History:  Procedure Laterality Date    implant first metatarsophalangeal joint right foot Right 12/07/2019   Dr. Doreatha Lew HERNIA REPAIR  1971    Family History  Problem Relation Age of Onset   Hyperlipidemia Mother    Ovarian cancer Maternal Grandmother    Emphysema Maternal Grandfather        Tobacco User   Stroke Paternal Grandmother     Social History   Socioeconomic History   Marital status: Married    Spouse name: Danny Fisher   Number of children: 0   Years of education: Not on file   Highest education level: Bachelor's degree (e.g., BA, AB, BS)  Occupational History   Occupation: Optician, dispensing   Tobacco Use   Smoking status: Former    Packs/day: 0.50    Years: 25.00    Additional pack years: 0.00    Total pack years: 12.50    Types: Cigarettes    Start date: 03/22/1984    Quit date: 11/22/2009    Years since quitting: 12.5   Smokeless tobacco: Never  Vaping Use   Vaping Use: Every day   Start date: 11/23/2011   Substances: Nicotine, Flavoring  Substance and Sexual Activity   Alcohol use: Yes    Comment: very seldom    Drug use: Yes    Types: Marijuana    Comment: usually at night before  sleep   Sexual activity: Yes  Partners: Male  Other Topics Concern   Not on file  Social History Narrative   He is married to Danny Fisher, he has working for [employer] for since 2009    Social Determinants of [employer]: Low Risk  (06/08/2022)   Overall Financial Resource Strain (CARDIA)    Difficulty of Paying Living Expenses: Not hard at all  Food Insecurity: No Food Insecurity (06/08/2022)   Hunger Vital Sign    Worried About Running Out of Food in the Last Year: Never true    [place] in the Last Year: Never true  Transportation Needs: No Transportation Needs (06/08/2022)   PRAPARE - Hydrologist (Medical): No    Lack of Transportation (Non-Medical): No  Physical Activity: Insufficiently Active (06/08/2022)   Exercise Vital Sign    Days of Exercise per Week: 2 days    Minutes of Exercise per Session: 20 min  Stress: No Stress Concern Present (06/08/2022)   [place]    Feeling of Stress : Only a little  Social Connections: Moderately Isolated (06/08/2022)   Social Connection and Isolation Panel [NHANES]    Frequency of Communication with Friends and Family: More than three times a week    Frequency of Social Gatherings with Friends and Family: Once a week    Attends Religious Services: Never    Marine scientist or Organizations: No    Attends Archivist Meetings: Never    Marital Status: Married  Human resources officer Violence: Not At Risk (06/08/2022)   Humiliation, Afraid, Rape, and Kick questionnaire    Fear of Current or Ex-Partner: No    Emotionally Abused: No    Physically Abused: No    Sexually Abused: No     Current Outpatient Medications:    atenolol (TENORMIN) 25 MG tablet, Take 1 tablet (25 mg total) by mouth daily., Disp: 90 tablet, Rfl: 3   Azelastine HCl 137 MCG/SPRAY SOLN, USE 1 SPRAY IN BOTH NOSTRILS  TWICE DAILY, Disp: 30 mL,  Rfl: 0   diphenhydrAMINE (BENADRYL ALLERGY) 25 MG tablet, Take 1 tablet (25 mg total) by mouth every 6 (six) hours as needed., Disp: 30 tablet, Rfl: 0   DULoxetine (CYMBALTA) 60 MG capsule, Take 1 capsule (60 mg total) by mouth daily., Disp: 90 capsule, Rfl: 1   emtricitabine-tenofovir (TRUVADA) 200-300 MG tablet, Take 1 tablet by mouth daily., Disp: 90 tablet, Rfl: 1   ezetimibe (ZETIA) 10 MG tablet, TAKE 1 TABLET BY MOUTH DAILY, Disp: 90 tablet, Rfl: 2   levocetirizine (XYZAL) 5 MG tablet, Take 1 tablet (5 mg total) by mouth every evening., Disp: 90 tablet, Rfl: 1   methocarbamol (ROBAXIN-750) 750 MG tablet, Take 1 tablet (750 mg total) by mouth 4 (four) times daily., Disp: 40 tablet, Rfl: 0   Multiple Vitamins-Minerals (MULTIVITAMIN WITH MINERALS) tablet, Take 1 tablet by mouth daily., Disp: 30 tablet, Rfl: 0   rosuvastatin (CRESTOR) 40 MG tablet, Take 1 tablet (40 mg total) by mouth daily., Disp: 90 tablet, Rfl: 1   valsartan-hydrochlorothiazide (DIOVAN-HCT) 160-12.5 MG tablet, TAKE 1 TABLET BY MOUTH DAILY IN  PLACE OF LOSARTAN, Disp: 90 tablet, Rfl: 0  No Known Allergies   ROS  Constitutional: Negative for fever , positive weight change.  Respiratory: Negative for cough and shortness of breath.   Cardiovascular: Negative for chest pain or palpitations.  Gastrointestinal: Negative for abdominal pain, no bowel changes.  Musculoskeletal:  Negative for gait problem or joint swelling.  Skin: Negative for rash.  Neurological: Negative for dizziness or headache.  No other specific complaints in a complete review of systems (except as listed in HPI above).    Objective  Vitals:   06/08/22 1424  BP: 120/68  Pulse: 90  Resp: 16  SpO2: 94%  Weight: 222 lb (100.7 kg)  Height: 5\' 11"  (1.803 m)    Body mass index is 30.96 kg/m.  Physical Exam  Constitutional: Patient appears well-developed and well-nourished. No distress.  HENT: Head: Normocephalic and atraumatic. Ears: B TMs ok, no  erythema or effusion; Nose: Nose normal. Mouth/Throat: Oropharynx is clear and moist. No oropharyngeal exudate.  Eyes: Conjunctivae and EOM are normal. Pupils are equal, round, and reactive to light. No scleral icterus.  Neck: Normal range of motion. Neck supple. No JVD present. No thyromegaly present.  Cardiovascular: Normal rate, regular rhythm and normal heart sounds.  No murmur heard. No BLE edema. Pulmonary/Chest: Effort normal and breath sounds normal. No respiratory distress. Abdominal: Soft. Bowel sounds are normal, no distension. There is no tenderness. no masses MALE GENITALIA: Normal descended testes bilaterally, no masses palpated, no hernias, no lesions, no discharge RECTAL: not done  Musculoskeletal: Normal range of motion, no joint effusions. No gross deformities Neurological: he is alert and oriented to person, place, and time. No cranial nerve deficit. Coordination, balance, strength, speech and gait are normal.  Skin: Skin is warm and dry. No rash noted. No erythema.  Psychiatric: Patient has a normal mood and affect. behavior is normal. Judgment and thought content normal.   Recent Results (from the past 2160 hour(s))  POCT HgB A1C     Status: Abnormal   Collection Time: 05/03/22  1:25 PM  Result Value Ref Range   Hemoglobin A1C 6.0 (A) 4.0 - 5.6 %   HbA1c POC (<> result, manual entry)     HbA1c, POC (prediabetic range)     HbA1c, POC (controlled diabetic range)    CBC with Differential/Platelet     Status: None   Collection Time: 06/04/22  4:28 PM  Result Value Ref Range   WBC 6.7 3.4 - 10.8 x10E3/uL   RBC 5.20 4.14 - 5.80 x10E6/uL   Hemoglobin 15.3 13.0 - 17.7 g/dL   Hematocrit 45.8 37.5 - 51.0 %   MCV 88 79 - 97 fL   MCH 29.4 26.6 - 33.0 pg   MCHC 33.4 31.5 - 35.7 g/dL   RDW 14.2 11.6 - 15.4 %   Platelets 185 150 - 450 x10E3/uL   Neutrophils 52 Not Estab. %   Lymphs 36 Not Estab. %   Monocytes 10 Not Estab. %   Eos 2 Not Estab. %   Basos 0 Not Estab. %    Neutrophils Absolute 3.5 1.4 - 7.0 x10E3/uL   Lymphocytes Absolute 2.4 0.7 - 3.1 x10E3/uL   Monocytes Absolute 0.7 0.1 - 0.9 x10E3/uL   EOS (ABSOLUTE) 0.1 0.0 - 0.4 x10E3/uL   Basophils Absolute 0.0 0.0 - 0.2 x10E3/uL   Immature Granulocytes 0 Not Estab. %   Immature Grans (Abs) 0.0 0.0 - 0.1 x10E3/uL  Comprehensive metabolic panel     Status: Abnormal   Collection Time: 06/04/22  4:28 PM  Result Value Ref Range   Glucose 89 70 - 99 mg/dL   BUN 22 6 - 24 mg/dL   Creatinine, Ser 1.00 0.76 - 1.27 mg/dL   eGFR 88 >59 mL/min/1.73   BUN/Creatinine Ratio 22 (H) 9 - 20  Sodium 145 (H) 134 - 144 mmol/L   Potassium 4.8 3.5 - 5.2 mmol/L   Chloride 104 96 - 106 mmol/L   CO2 24 20 - 29 mmol/L   Calcium 9.4 8.7 - 10.2 mg/dL   Total Protein 6.6 6.0 - 8.5 g/dL   Albumin 4.6 3.8 - 4.9 g/dL   Globulin, Total 2.0 1.5 - 4.5 g/dL   Albumin/Globulin Ratio 2.3 (H) 1.2 - 2.2   Bilirubin Total 0.4 0.0 - 1.2 mg/dL   Alkaline Phosphatase 70 44 - 121 IU/L   AST 26 0 - 40 IU/L   ALT 37 0 - 44 IU/L  Ferritin     Status: None   Collection Time: 06/04/22  4:28 PM  Result Value Ref Range   Ferritin 56 30 - 400 ng/mL  HIV Antibody (routine testing w rflx)     Status: None   Collection Time: 06/04/22  4:28 PM  Result Value Ref Range   HIV Screen 4th Generation wRfx Non Reactive Non Reactive    Comment: HIV Negative HIV-1/HIV-2 antibodies and HIV-1 p24 antigen were NOT detected. There is no laboratory evidence of HIV infection.   Magnesium     Status: None   Collection Time: 06/04/22  4:28 PM  Result Value Ref Range   Magnesium 2.3 1.6 - 2.3 mg/dL     Fall Risk:    06/08/2022    2:23 PM 05/03/2022    1:14 PM 03/26/2022   11:36 AM 10/07/2021    3:25 PM 06/05/2021    2:54 PM  Fall Risk   Falls in the past year? 0 0 0 0 0  Number falls in past yr: 0 0 0  0  Injury with Fall? 0 0 0  0  Risk for fall due to : No Fall Risks No Fall Risks  No Fall Risks No Fall Risks  Follow up Falls prevention  discussed Falls prevention discussed Falls evaluation completed Falls prevention discussed Falls prevention discussed     Functional Status Survey: Is the patient deaf or have difficulty hearing?: Yes Does the patient have difficulty seeing, even when wearing glasses/contacts?: No Does the patient have difficulty concentrating, remembering, or making decisions?: No Does the patient have difficulty walking or climbing stairs?: No Does the patient have difficulty dressing or bathing?: No Does the patient have difficulty doing errands alone such as visiting a doctor's office or shopping?: No    Assessment & Plan  1. Well adult exam   2. Abnormal [place] (ultrasound) of abdomen  - US ABDOMEN LIMITED RUQ (LIVER/GB); Future     -Prostate cancer screening and PSA options (with potential risks and benefits of testing vs not testing) were discussed along with recent recs/guidelines. -USPSTF grade A and B recommendations reviewed with patient; age-appropriate recommendations, preventive care, screening tests, etc discussed and encouraged; healthy living encouraged; see AVS for patient education given to patient -Discussed importance of 150 minutes of physical activity weekly, eat two servings of fish weekly, eat one serving of tree nuts ( cashews, pistachios, pecans, almonds.Marland Kitchen) every other day, eat 6 servings of fruit/vegetables daily and drink plenty of water and avoid sweet beverages.  -Reviewed Health Maintenance: yes

## 2022-06-08 ENCOUNTER — Encounter: Payer: Self-pay | Admitting: Family Medicine

## 2022-06-08 ENCOUNTER — Ambulatory Visit (INDEPENDENT_AMBULATORY_CARE_PROVIDER_SITE_OTHER): Payer: BC Managed Care – PPO | Admitting: Family Medicine

## 2022-06-08 VITALS — BP 120/68 | HR 90 | Resp 16 | Ht 71.0 in | Wt 222.0 lb

## 2022-06-08 DIAGNOSIS — R935 Abnormal findings on diagnostic imaging of other abdominal regions, including retroperitoneum: Secondary | ICD-10-CM

## 2022-06-08 DIAGNOSIS — Z0000 Encounter for general adult medical examination without abnormal findings: Secondary | ICD-10-CM

## 2022-07-28 ENCOUNTER — Other Ambulatory Visit: Payer: Self-pay | Admitting: Family Medicine

## 2022-07-28 DIAGNOSIS — I10 Essential (primary) hypertension: Secondary | ICD-10-CM

## 2022-08-21 ENCOUNTER — Other Ambulatory Visit: Payer: Self-pay | Admitting: Family Medicine

## 2022-08-21 DIAGNOSIS — Z9189 Other specified personal risk factors, not elsewhere classified: Secondary | ICD-10-CM

## 2022-09-19 ENCOUNTER — Other Ambulatory Visit: Payer: Self-pay | Admitting: Family Medicine

## 2022-09-19 DIAGNOSIS — E785 Hyperlipidemia, unspecified: Secondary | ICD-10-CM

## 2022-09-19 DIAGNOSIS — F411 Generalized anxiety disorder: Secondary | ICD-10-CM

## 2022-09-20 ENCOUNTER — Other Ambulatory Visit: Payer: Self-pay

## 2022-09-20 DIAGNOSIS — E785 Hyperlipidemia, unspecified: Secondary | ICD-10-CM

## 2022-09-20 DIAGNOSIS — F411 Generalized anxiety disorder: Secondary | ICD-10-CM

## 2022-09-20 MED ORDER — DULOXETINE HCL 60 MG PO CPEP: 60.0000 mg | ORAL_CAPSULE | Freq: Every day | ORAL | 0 refills | Status: DC

## 2022-09-20 MED ORDER — ROSUVASTATIN CALCIUM 40 MG PO TABS: 40.0000 mg | ORAL_TABLET | Freq: Every day | ORAL | 0 refills | Status: DC

## 2022-10-04 ENCOUNTER — Other Ambulatory Visit: Payer: Self-pay | Admitting: Family Medicine

## 2022-10-04 DIAGNOSIS — I10 Essential (primary) hypertension: Secondary | ICD-10-CM

## 2022-10-05 ENCOUNTER — Other Ambulatory Visit: Payer: Self-pay

## 2022-10-18 ENCOUNTER — Other Ambulatory Visit: Payer: Self-pay | Admitting: Family Medicine

## 2022-10-18 DIAGNOSIS — R000 Tachycardia, unspecified: Secondary | ICD-10-CM

## 2022-11-01 ENCOUNTER — Encounter: Payer: Self-pay | Admitting: Family Medicine

## 2022-11-01 NOTE — Progress Notes (Unsigned)
Name: Danny Fisher   MRN: [id number]    DOB: 04-13-1965   Date:11/02/2022       Progress Note  Subjective  Chief Complaint  Follow Up  HPI  Right index finger pain: symptoms started suddenly , woke up one morning , about 3 weeks ago , with pain, swelling of right index finger, inability to make a fist, swelling and pain still present but not as intense, initially he took some nsaid's, he works using a mouse all day. Not tender light touch, some bruising on PIP joint ventrally  Dyslipidemia: he denies significant history of heart disease , but mother has dyslipidemia and takes cholesterol medication and grandmother had a stroke in her 81's. He has been taking Crestor and Zetia now, last LDL 38     Elevated liver enzymes: it has been going on for a while, he has obesity and dyslipidemia. US done 04/2022 showed multiple gallbladder polyps and we will repeat US in one year   Hyperglycemia: last A1C was up to 6.2 % so he has changed his diet, avoiding sugar, very seldom has a treat. No desserts since Christmas. Weight was down to 213 lbs but after a biometric screen at work he got upset about his bp and started to eat sugar again, A1C had improved down to 5.9 %. Advised him to cut down on carbohydrates. Weight is still 9 lbs lighter than one year ago   AR: symptoms are worse in the Spring and Fall . She stopped nasal spray but is taking Xyzal daily, he is allergic to their cats    GAD: he continues to pick on his nails but not as much as he used to prior to starting on Duloxetine. He has been smoking marijuana to help him sleep.  He does not worry constantly like he use to. He is has been stable on medication. Stable    Homosexual: married, but open relationship, on Truvada and denies side effects. Last HIV negative   HTN: BP today is controlled , taking Atenolol every night and valsartan hydrochlorothiazide during the day, heart rate is back to normal, denies chest pain, palpitation or sob    Patient Active Problem List   Diagnosis Date Noted   Leg cramps 05/03/2022   Hypertension, benign 05/03/2022   Perennial allergic rhinitis with seasonal variation 05/03/2022   Elevated liver enzymes 05/03/2022   Dyslipidemia 11/22/2017   Tympanic membrane rupture, left 11/22/2017   Obesity (BMI 30.0-34.9) 11/22/2017   At risk for HIV due to homosexual contact 11/22/2017   ADD (attention deficit disorder) 11/22/2017    Past Surgical History:  Procedure Laterality Date    implant first metatarsophalangeal joint right foot Right 12/07/2019   Dr. Milana Huntsman HERNIA REPAIR  1971    Family History  Problem Relation Age of Onset   Hyperlipidemia Mother    Ovarian cancer Maternal Grandmother    Emphysema Maternal Grandfather        Tobacco User   Stroke Paternal Grandmother     Social History   Tobacco Use   Smoking status: Former    Current packs/day: 0.00    Average packs/day: 0.5 packs/day for 25.7 years (12.8 ttl pk-yrs)    Types: Cigarettes    Start date: 03/22/1984    Quit date: 11/22/2009    Years since quitting: 12.9   Smokeless tobacco: Never  Substance Use Topics   Alcohol use: Yes    Comment: very seldom      Current Outpatient  Medications:    Multiple Vitamins-Minerals (MULTIVITAMIN WITH MINERALS) tablet, Take 1 tablet by mouth daily., Disp: 30 tablet, Rfl: 0   atenolol (TENORMIN) 25 MG tablet, Take 1 tablet (25 mg total) by mouth daily., Disp: 90 tablet, Rfl: 1   Azelastine HCl 137 MCG/SPRAY SOLN, Place 2 sprays into the nose daily at 12 noon., Disp: 90 mL, Rfl: 0   DULoxetine (CYMBALTA) 60 MG capsule, Take 1 capsule (60 mg total) by mouth daily., Disp: 90 capsule, Rfl: 1   emtricitabine-tenofovir (TRUVADA) 200-300 MG tablet, Take 1 tablet by mouth daily., Disp: 90 tablet, Rfl: 1   ezetimibe (ZETIA) 10 MG tablet, Take 1 tablet (10 mg total) by mouth daily., Disp: 90 tablet, Rfl: 2   levocetirizine (XYZAL) 5 MG tablet, Take 1 tablet (5 mg total) by  mouth every evening., Disp: 90 tablet, Rfl: 1   rosuvastatin (CRESTOR) 40 MG tablet, Take 1 tablet (40 mg total) by mouth daily., Disp: 90 tablet, Rfl: 1   valsartan-hydrochlorothiazide (DIOVAN-HCT) 160-12.5 MG tablet, Take 1 tablet by mouth daily. In place of losartan, Disp: 90 tablet, Rfl: 1  No Known Allergies  I personally reviewed active problem list, medication list, allergies, family history, social history, health maintenance with the patient/caregiver today.   ROS  Ten systems reviewed and is negative except as mentioned in HPI    Objective  Vitals:   11/02/22 1323  BP: 120/62  Pulse: 92  Resp: 18  Temp: 98 F (36.7 C)  TempSrc: Oral  SpO2: 98%  Weight: 218 lb 4.8 oz (99 kg)    Body mass index is 30.45 kg/m.  Physical Exam  Constitutional: Patient appears well-developed and well-nourished. Obes No distress.  HEENT: head atraumatic, normocephalic, pupils equal and reactive to light, neck supple, throat within normal limits Cardiovascular: Normal rate, regular rhythm and normal heart sounds.  No murmur heard. No BLE edema. Pulmonary/Chest: Effort normal and breath sounds normal. No respiratory distress. Muscular skeletal: see attached photo, tender to deep touch, decrease rom of right index finger, no increase in warmth  Abdominal: Soft.  There is no tenderness. Psychiatric: Patient has a normal mood and affect. behavior is normal. Judgment and thought content normal.   PHQ2/9:    11/02/2022    1:30 PM 06/08/2022    2:23 PM 05/03/2022    1:14 PM 03/26/2022   11:38 AM 10/07/2021    3:42 PM  Depression screen PHQ 2/9  Decreased Interest 0 0 0 0 0  Down, Depressed, Hopeless 0 0 0 0 0  PHQ - 2 Score 0 0 0 0 0  Altered sleeping 0 0 0  1  Tired, decreased energy 0 0 0  1  Change in appetite 0 0 0  0  Feeling bad or failure about yourself  0 0 0  0  Trouble concentrating 0 0 0  0  Moving slowly or fidgety/restless 0 0 0  0  Suicidal thoughts 0 0 0  0  PHQ-9 Score  0 0 0  2  Difficult doing work/chores     Not difficult at all    phq 9 is negative   Fall Risk:    11/02/2022    1:30 PM 06/08/2022    2:23 PM 05/03/2022    1:14 PM 03/26/2022   11:36 AM 10/07/2021    3:25 PM  Fall Risk   Falls in the past year? 0 0 0 0 0  Number falls in past yr:  0 0 0   Injury  with Fall?  0 0 0   Risk for fall due to : No Fall Risks No Fall Risks No Fall Risks  No Fall Risks  Follow up Falls prevention discussed Falls prevention discussed Falls prevention discussed Falls evaluation completed Falls prevention discussed      Functional Status Survey: Is the patient deaf or have difficulty hearing?: No Does the patient have difficulty seeing, even when wearing glasses/contacts?: No Does the patient have difficulty concentrating, remembering, or making decisions?: No Does the patient have difficulty walking or climbing stairs?: No Does the patient have difficulty dressing or bathing?: No Does the patient have difficulty doing errands alone such as visiting a doctor's office or shopping?: No    Assessment & Plan  1. Pain of finger of right hand  - Ambulatory referral to Orthopedic Surgery  2. Tachycardia  - atenolol (TENORMIN) 25 MG tablet; Take 1 tablet (25 mg total) by mouth daily.  Dispense: 90 tablet; Refill: 1  3. GAD (generalized anxiety disorder)  - DULoxetine (CYMBALTA) 60 MG capsule; Take 1 capsule (60 mg total) by mouth daily.  Dispense: 90 capsule; Refill: 1  4. Perennial allergic rhinitis with seasonal variation  - Azelastine HCl 137 MCG/SPRAY SOLN; Place 2 sprays into the nose daily at 12 noon.  Dispense: 90 mL; Refill: 0 - levocetirizine (XYZAL) 5 MG tablet; Take 1 tablet (5 mg total) by mouth every evening.  Dispense: 90 tablet; Refill: 1  5. Dyslipidemia (high LDL; low HDL)  - ezetimibe (ZETIA) 10 MG tablet; Take 1 tablet (10 mg total) by mouth daily.  Dispense: 90 tablet; Refill: 2 - rosuvastatin (CRESTOR) 40 MG tablet; Take 1 tablet  (40 mg total) by mouth daily.  Dispense: 90 tablet; Refill: 1  6. At risk for HIV due to homosexual contact  - emtricitabine-tenofovir (TRUVADA) 200-300 MG tablet; Take 1 tablet by mouth daily.  Dispense: 90 tablet; Refill: 1  7. Hypertension, benign  - valsartan-hydrochlorothiazide (DIOVAN-HCT) 160-12.5 MG tablet; Take 1 tablet by mouth daily. In place of losartan  Dispense: 90 tablet; Refill: 1  8. Gallbladder polyp  - US ABDOMEN LIMITED RUQ (LIVER/GB); Future

## 2022-11-02 ENCOUNTER — Ambulatory Visit: Payer: BC Managed Care – PPO | Admitting: Family Medicine

## 2022-11-02 ENCOUNTER — Encounter: Payer: Self-pay | Admitting: Family Medicine

## 2022-11-02 VITALS — BP 120/62 | HR 92 | Temp 98.0°F | Resp 18 | Ht 71.0 in | Wt 218.3 lb

## 2022-11-02 DIAGNOSIS — J3089 Other allergic rhinitis: Secondary | ICD-10-CM

## 2022-11-02 DIAGNOSIS — M79644 Pain in right finger(s): Secondary | ICD-10-CM

## 2022-11-02 DIAGNOSIS — I10 Essential (primary) hypertension: Secondary | ICD-10-CM

## 2022-11-02 DIAGNOSIS — K824 Cholesterolosis of gallbladder: Secondary | ICD-10-CM

## 2022-11-02 DIAGNOSIS — R000 Tachycardia, unspecified: Secondary | ICD-10-CM

## 2022-11-02 DIAGNOSIS — F411 Generalized anxiety disorder: Secondary | ICD-10-CM

## 2022-11-02 DIAGNOSIS — Z9189 Other specified personal risk factors, not elsewhere classified: Secondary | ICD-10-CM

## 2022-11-02 DIAGNOSIS — J302 Other seasonal allergic rhinitis: Secondary | ICD-10-CM

## 2022-11-02 DIAGNOSIS — E785 Hyperlipidemia, unspecified: Secondary | ICD-10-CM

## 2022-11-02 MED ORDER — ATENOLOL 25 MG PO TABS
25.0000 mg | ORAL_TABLET | Freq: Every day | ORAL | 1 refills | Status: DC
Start: 2022-11-02 — End: 2023-06-03

## 2022-11-02 MED ORDER — AZELASTINE HCL 137 MCG/SPRAY NA SOLN
2.0000 | Freq: Every day | NASAL | 0 refills | Status: DC
Start: 2022-11-02 — End: 2023-06-03

## 2022-11-02 MED ORDER — VALSARTAN-HYDROCHLOROTHIAZIDE 160-12.5 MG PO TABS
1.0000 | ORAL_TABLET | Freq: Every day | ORAL | 1 refills | Status: DC
Start: 2022-11-02 — End: 2023-02-21

## 2022-11-02 MED ORDER — EZETIMIBE 10 MG PO TABS: 10.0000 mg | ORAL_TABLET | Freq: Every day | ORAL | 2 refills | Status: AC

## 2022-11-02 MED ORDER — LEVOCETIRIZINE DIHYDROCHLORIDE 5 MG PO TABS
5.0000 mg | ORAL_TABLET | Freq: Every evening | ORAL | 1 refills | Status: DC
Start: 2022-11-02 — End: 2023-06-03

## 2022-11-02 MED ORDER — EMTRICITABINE-TENOFOVIR DF 200-300 MG PO TABS
1.0000 | ORAL_TABLET | Freq: Every day | ORAL | 1 refills | Status: DC
Start: 2022-11-02 — End: 2023-05-04

## 2022-11-02 MED ORDER — ROSUVASTATIN CALCIUM 40 MG PO TABS
40.0000 mg | ORAL_TABLET | Freq: Every day | ORAL | 1 refills | Status: DC
Start: 2022-11-02 — End: 2023-06-03

## 2022-11-02 MED ORDER — DULOXETINE HCL 60 MG PO CPEP
60.0000 mg | ORAL_CAPSULE | Freq: Every day | ORAL | 1 refills | Status: DC
Start: 2022-11-02 — End: 2023-06-03

## 2022-11-09 DIAGNOSIS — M65321 Trigger finger, right index finger: Secondary | ICD-10-CM | POA: Diagnosis not present

## 2022-12-10 ENCOUNTER — Ambulatory Visit: Payer: BC Managed Care – PPO | Admitting: Family Medicine

## 2023-02-20 ENCOUNTER — Other Ambulatory Visit: Payer: Self-pay | Admitting: Family Medicine

## 2023-02-20 DIAGNOSIS — I10 Essential (primary) hypertension: Secondary | ICD-10-CM

## 2023-04-30 ENCOUNTER — Other Ambulatory Visit: Payer: Self-pay | Admitting: Family Medicine

## 2023-05-02 ENCOUNTER — Ambulatory Visit
Admission: RE | Admit: 2023-05-02 | Discharge: 2023-05-02 | Disposition: A | Discharge status: Home / Self Care | Payer: BC Managed Care – PPO | Source: Ambulatory Visit | Attending: Family Medicine | Admitting: Family Medicine

## 2023-05-02 DIAGNOSIS — K824 Cholesterolosis of gallbladder: Secondary | ICD-10-CM | POA: Diagnosis not present

## 2023-05-02 NOTE — Telephone Encounter (Signed)
 Requested medication (s) are due for refill today: Yes  Requested medication (s) are on the active medication list: Yes  Last refill:  11/02/22  Future visit scheduled: Yes  Notes to clinic:  Unable to refill due to no refill protocol for this medication.      Requested Prescriptions  Pending Prescriptions Disp Refills   emtricitabine -tenofovir  (TRUVADA ) 200-300 MG tablet [Pharmacy Med Name: EMTRICIT/TENOFOVIR  TAB 200-300MG ] 90 tablet 3    Sig: Take 1 tablet by mouth daily.     Off-Protocol Failed - 05/02/2023  1:10 PM      Failed - Medication not assigned to a protocol, review manually.      Passed - Valid encounter within last 12 months    Recent Outpatient Visits           6 months ago Pain of finger of right hand   [facility] Arleen Lacer, MD   10 months ago Well adult exam   [facility] Arleen Lacer, MD   12 months ago Elevated liver enzymes   [facility] Arleen Lacer, MD   1 year ago Acute bilateral low back pain without sciatica   [facility] Quinton Buckler, FNP   1 year ago Hypertension, benign   [facility] Sowles, Krichna, MD       Future Appointments             In 1 month Sowles, Krichna, MD [facility]   In 1 month Sowles, Krichna, MD [facility]

## 2023-05-03 ENCOUNTER — Encounter: Payer: Self-pay | Admitting: Family Medicine

## 2023-05-03 NOTE — Telephone Encounter (Signed)
Lvm for pt to get on dr Carlynn Purl schedule this week if he is available for med refills instead of march.

## 2023-05-21 ENCOUNTER — Other Ambulatory Visit: Payer: Self-pay | Admitting: Family Medicine

## 2023-05-21 DIAGNOSIS — I10 Essential (primary) hypertension: Secondary | ICD-10-CM

## 2023-05-23 NOTE — Telephone Encounter (Signed)
 Last f/u 10/2022

## 2023-06-02 ENCOUNTER — Other Ambulatory Visit: Payer: Self-pay | Admitting: Family Medicine

## 2023-06-02 DIAGNOSIS — E785 Hyperlipidemia, unspecified: Secondary | ICD-10-CM

## 2023-06-02 DIAGNOSIS — F411 Generalized anxiety disorder: Secondary | ICD-10-CM

## 2023-06-03 ENCOUNTER — Ambulatory Visit: Payer: BC Managed Care – PPO | Admitting: Family Medicine

## 2023-06-03 VITALS — BP 114/70 | HR 99 | Temp 97.9°F | Resp 16 | Ht 71.0 in | Wt 198.7 lb

## 2023-06-03 DIAGNOSIS — Z131 Encounter for screening for diabetes mellitus: Secondary | ICD-10-CM

## 2023-06-03 DIAGNOSIS — H6692 Otitis media, unspecified, left ear: Secondary | ICD-10-CM

## 2023-06-03 DIAGNOSIS — J3089 Other allergic rhinitis: Secondary | ICD-10-CM | POA: Diagnosis not present

## 2023-06-03 DIAGNOSIS — E785 Hyperlipidemia, unspecified: Secondary | ICD-10-CM | POA: Diagnosis not present

## 2023-06-03 DIAGNOSIS — I10 Essential (primary) hypertension: Secondary | ICD-10-CM | POA: Diagnosis not present

## 2023-06-03 DIAGNOSIS — Z113 Encounter for screening for infections with a predominantly sexual mode of transmission: Secondary | ICD-10-CM | POA: Diagnosis not present

## 2023-06-03 DIAGNOSIS — R634 Abnormal weight loss: Secondary | ICD-10-CM

## 2023-06-03 DIAGNOSIS — J302 Other seasonal allergic rhinitis: Secondary | ICD-10-CM

## 2023-06-03 DIAGNOSIS — R000 Tachycardia, unspecified: Secondary | ICD-10-CM

## 2023-06-03 DIAGNOSIS — F411 Generalized anxiety disorder: Secondary | ICD-10-CM

## 2023-06-03 DIAGNOSIS — H9312 Tinnitus, left ear: Secondary | ICD-10-CM

## 2023-06-03 MED ORDER — DULOXETINE HCL 60 MG PO CPEP
60.0000 mg | ORAL_CAPSULE | Freq: Every day | ORAL | 1 refills | Status: AC
Start: 2023-06-03 — End: ?

## 2023-06-03 MED ORDER — EMTRICITABINE-TENOFOVIR DF 200-300 MG PO TABS: 1.0000 | ORAL_TABLET | Freq: Every day | ORAL | 1 refills | Status: AC

## 2023-06-03 MED ORDER — AZELASTINE HCL 137 MCG/SPRAY NA SOLN: 2.0000 | Freq: Every day | NASAL | 0 refills | Status: AC

## 2023-06-03 MED ORDER — ATENOLOL 25 MG PO TABS: 25.0000 mg | ORAL_TABLET | Freq: Every day | ORAL | 1 refills | Status: AC

## 2023-06-03 MED ORDER — VALSARTAN-HYDROCHLOROTHIAZIDE 80-12.5 MG PO TABS: 1.0000 | ORAL_TABLET | Freq: Every day | ORAL | 1 refills | Status: AC

## 2023-06-03 MED ORDER — LEVOCETIRIZINE DIHYDROCHLORIDE 5 MG PO TABS: 5.0000 mg | ORAL_TABLET | Freq: Every evening | ORAL | 1 refills | Status: AC

## 2023-06-03 MED ORDER — ROSUVASTATIN CALCIUM 40 MG PO TABS: 40.0000 mg | ORAL_TABLET | Freq: Every day | ORAL | 1 refills | Status: AC

## 2023-06-03 NOTE — Progress Notes (Signed)
 Name: Danny Fisher   MRN: [id number]    DOB: [birth year]   Date:06/03/2023       Progress Note  Subjective  Chief Complaint  Chief Complaint  Patient presents with   Medical Management of Chronic Issues   HPI    Dyslipidemia: he denies significant history of heart disease , but mother has dyslipidemia and takes cholesterol medication and grandmother had a stroke in her 1's. He has been taking Crestor and Zetia  and is due for repeat labbs    Elevated liver enzymes: it has been going on for a while, he has obesity and dyslipidemia. US done 04/2022 showed multiple gallbladder polyps, repeat US after one year was stable   Hyperglycemia: he is now on a keto diet, since October 2024 and has lost 20 lbs. We will recheck labs   AR: symptoms are worse in the Spring and Fall . He needs a refill of Astelin    GAD: he continues to pick on his nails but not as much as he used to prior to starting on Duloxetine, he has been stable on current regiment and denies side effects   Homosexual: married, but open relationship, on Truvada and denies side effects. Last HIV negative Due for repeat labs   HTN: BP  is towards low end of normal, he is out of Atenolol for about 4 days, taking valsartan hydrochlorothiazide 160/12.5 mg , he lost 20 lbs since last visit. Advised to resume Atenolol for tachycardia and anxiety and we will decrease dose of valsartan hydrochlorothiazide to 80/12.5 mg. He states he feels well, no chest pain or palpitation   Left ear tinnitus: going on for a couple of months. It started after an URI that was followed by intense ear pain and drainage from left ear canal. He has a history of rupture of TM after a previous URI. He is now willing to see ENT   Patient Active Problem List   Diagnosis Date Noted   Leg cramps 05/03/2022   Hypertension, benign 05/03/2022   Perennial allergic rhinitis with seasonal variation 05/03/2022   Elevated liver enzymes 05/03/2022   Dyslipidemia 11/22/2017    Tympanic membrane rupture, left 11/22/2017   Obesity (BMI 30.0-34.9) 11/22/2017   At risk for HIV due to homosexual contact 11/22/2017   ADD (attention deficit disorder) 11/22/2017    Past Surgical History:  Procedure Laterality Date    implant first metatarsophalangeal joint right foot Right 12/07/2019   Dr. Milana Huntsman HERNIA REPAIR  1971    Family History  Problem Relation Age of Onset   Hyperlipidemia Mother    Ovarian cancer Maternal Grandmother    Emphysema Maternal Grandfather        Tobacco User   Stroke Paternal Grandmother     Social History   Tobacco Use   Smoking status: Former    Current packs/day: 0.00    Average packs/day: 0.5 packs/day for 25.7 years (12.8 ttl pk-yrs)    Types: Cigarettes    Start date: 03/22/1984    Quit date: 11/22/2009    Years since quitting: 13.5   Smokeless tobacco: Never  Substance Use Topics   Alcohol use: Yes    Comment: very seldom      Current Outpatient Medications:    atenolol (TENORMIN) 25 MG tablet, Take 1 tablet (25 mg total) by mouth daily., Disp: 90 tablet, Rfl: 1   Azelastine HCl 137 MCG/SPRAY SOLN, Place 2 sprays into the nose daily at 12 noon., Disp: 90  mL, Rfl: 0   DULoxetine (CYMBALTA) 60 MG capsule, Take 1 capsule (60 mg total) by mouth daily., Disp: 90 capsule, Rfl: 1   emtricitabine-tenofovir (TRUVADA) 200-300 MG tablet, TAKE 1 TABLET BY MOUTH DAILY, Disp: 90 tablet, Rfl: 0   ezetimibe (ZETIA) 10 MG tablet, Take 1 tablet (10 mg total) by mouth daily., Disp: 90 tablet, Rfl: 2   levocetirizine (XYZAL) 5 MG tablet, Take 1 tablet (5 mg total) by mouth every evening., Disp: 90 tablet, Rfl: 1   Multiple Vitamins-Minerals (MULTIVITAMIN WITH MINERALS) tablet, Take 1 tablet by mouth daily., Disp: 30 tablet, Rfl: 0   rosuvastatin (CRESTOR) 40 MG tablet, Take 1 tablet (40 mg total) by mouth daily., Disp: 90 tablet, Rfl: 1   valsartan-hydrochlorothiazide (DIOVAN-HCT) 160-12.5 MG tablet, TAKE 1 TABLET BY MOUTH DAILY  IN  PLACE OF LOSARTAN, Disp: 90 tablet, Rfl: 0  No Known Allergies  I personally reviewed active problem list, medication list, allergies, family history with the patient/caregiver today.   ROS  Constitutional: Negative for fever or weight change.  Respiratory: Negative for cough and shortness of breath.   Cardiovascular: Negative for chest pain or palpitations.  Gastrointestinal: Negative for abdominal pain, no bowel changes.  Musculoskeletal: Negative for gait problem or joint swelling.  Skin: Negative for rash.  Neurological: Negative for dizziness or headache.  No other specific complaints in a complete review of systems (except as listed in HPI above).   Objective  Vitals:   06/03/23 1316  BP: 114/70  Pulse: 99  Resp: 16  Temp: 97.9 F (36.6 C)  TempSrc: Oral  SpO2: 98%  Weight: 198 lb 11.2 oz (90.1 kg)  Height: 5\' 11"  (1.803 m)    Body mass index is 27.71 kg/m.  Physical Exam  Constitutional: Patient appears well-developed and well-nourished.  No distress.  HEENT: head atraumatic, normocephalic, pupils equal and reactive to light, neck supple Cardiovascular: Normal rate, regular rhythm and normal heart sounds.  No murmur heard. No BLE edema. Pulmonary/Chest: Effort normal and breath sounds normal. No respiratory distress. Abdominal: Soft.  There is no tenderness. Psychiatric: Patient has a normal mood and affect. behavior is normal. Judgment and thought content normal.   Diabetic Foot Exam:     PHQ2/9:    06/03/2023    1:12 PM 11/02/2022    1:30 PM 06/08/2022    2:23 PM 05/03/2022    1:14 PM 03/26/2022   11:38 AM  Depression screen PHQ 2/9  Decreased Interest 0 0 0 0 0  Down, Depressed, Hopeless 0 0 0 0 0  PHQ - 2 Score 0 0 0 0 0  Altered sleeping 0 0 0 0   Tired, decreased energy 0 0 0 0   Change in appetite 0 0 0 0   Feeling bad or failure about yourself  0 0 0 0   Trouble concentrating 0 0 0 0   Moving slowly or fidgety/restless 0 0 0 0   Suicidal  thoughts 0 0 0 0   PHQ-9 Score 0 0 0 0   Difficult doing work/chores Not difficult at all        phq 9 is negative  Fall Risk:    11/02/2022    1:30 PM 06/08/2022    2:23 PM 05/03/2022    1:14 PM 03/26/2022   11:36 AM 10/07/2021    3:25 PM  Fall Risk   Falls in the past year? 0 0 0 0 0  Number falls in past yr:  0 0 0  Injury with Fall?  0 0 0   Risk for fall due to : No Fall Risks No Fall Risks No Fall Risks  No Fall Risks  Follow up Falls prevention discussed Falls prevention discussed Falls prevention discussed Falls evaluation completed Falls prevention discussed     Assessment & Plan   1. Dyslipidemia (high LDL; low HDL) (Primary)  - Lipid panel - rosuvastatin (CRESTOR) 40 MG tablet; Take 1 tablet (40 mg total) by mouth daily.  Dispense: 90 tablet; Refill: 1  2. Perennial allergic rhinitis with seasonal variation  - Azelastine HCl 137 MCG/SPRAY SOLN; Place 2 sprays into the nose daily at 12 noon.  Dispense: 90 mL; Refill: 0 - levocetirizine (XYZAL) 5 MG tablet; Take 1 tablet (5 mg total) by mouth every evening.  Dispense: 90 tablet; Refill: 1  3. Hypertension, benign  - CBC with Differential/Platelet - Comprehensive metabolic panel  4. GAD (generalized anxiety disorder)  - DULoxetine (CYMBALTA) 60 MG capsule; Take 1 capsule (60 mg total) by mouth daily.  Dispense: 90 capsule; Refill: 1  5. Tachycardia  - TSH - atenolol (TENORMIN) 25 MG tablet; Take 1 tablet (25 mg total) by mouth daily.  Dispense: 90 tablet; Refill: 1  6. Routine screening for STI (sexually transmitted infection)  - HIV Antibody (routine testing w rflx) - RPR - GC/Chlamydia Probe Amp  7. Diabetes mellitus screening  - Hemoglobin A1c  8. Weight loss  - B12 and Folate Panel - VITAMIN D 25 Hydroxy (Vit-D Deficiency, Fractures)

## 2023-06-06 DIAGNOSIS — Z131 Encounter for screening for diabetes mellitus: Secondary | ICD-10-CM | POA: Diagnosis not present

## 2023-06-06 DIAGNOSIS — R634 Abnormal weight loss: Secondary | ICD-10-CM | POA: Diagnosis not present

## 2023-06-06 DIAGNOSIS — Z113 Encounter for screening for infections with a predominantly sexual mode of transmission: Secondary | ICD-10-CM | POA: Diagnosis not present

## 2023-06-06 DIAGNOSIS — E785 Hyperlipidemia, unspecified: Secondary | ICD-10-CM | POA: Diagnosis not present

## 2023-06-06 DIAGNOSIS — I10 Essential (primary) hypertension: Secondary | ICD-10-CM | POA: Diagnosis not present

## 2023-06-07 ENCOUNTER — Encounter: Payer: Self-pay | Admitting: Family Medicine

## 2023-06-07 LAB — LIPID PANEL
Chol/HDL Ratio: 5.2 ratio — ABNORMAL HIGH (ref 0.0–5.0)
Cholesterol, Total: 274 mg/dL — ABNORMAL HIGH (ref 100–199)
HDL: 53 mg/dL (ref >39)
LDL Chol Calc (NIH): 113 mg/dL — ABNORMAL HIGH (ref 0–99)
Triglycerides: 616 mg/dL (ref 0–149)
VLDL Cholesterol Cal: 108 mg/dL — ABNORMAL HIGH (ref 5–40)

## 2023-06-07 LAB — COMPREHENSIVE METABOLIC PANEL
ALT: 18 IU/L (ref 0–44)
AST: 17 IU/L (ref 0–40)
Albumin: 4.7 g/dL (ref 3.8–4.9)
Alkaline Phosphatase: 67 IU/L (ref 44–121)
BUN/Creatinine Ratio: 18 (ref 9–20)
BUN: 24 mg/dL (ref 6–24)
Bilirubin Total: 0.3 mg/dL (ref 0.0–1.2)
CO2: 23 mmol/L (ref 20–29)
Calcium: 10.2 mg/dL (ref 8.7–10.2)
Chloride: 99 mmol/L (ref 96–106)
Creatinine, Ser: 1.3 mg/dL — ABNORMAL HIGH (ref 0.76–1.27)
Globulin, Total: 2 g/dL (ref 1.5–4.5)
Glucose: 105 mg/dL — ABNORMAL HIGH (ref 70–99)
Potassium: 4.4 mmol/L (ref 3.5–5.2)
Sodium: 142 mmol/L (ref 134–144)
Total Protein: 6.7 g/dL (ref 6.0–8.5)
eGFR: 64 mL/min/{1.73_m2} (ref >59)

## 2023-06-07 LAB — CBC WITH DIFFERENTIAL/PLATELET
Basophils Absolute: 0 10*3/uL (ref 0.0–0.2)
Basos: 0 %
EOS (ABSOLUTE): 0.1 10*3/uL (ref 0.0–0.4)
Eos: 2 %
Hematocrit: 48.9 % (ref 37.5–51.0)
Hemoglobin: 16.5 g/dL (ref 13.0–17.7)
Immature Grans (Abs): 0 10*3/uL (ref 0.0–0.1)
Immature Granulocytes: 0 %
Lymphocytes Absolute: 1.9 10*3/uL (ref 0.7–3.1)
Lymphs: 37 %
MCH: 29.3 pg (ref 26.6–33.0)
MCHC: 33.7 g/dL (ref 31.5–35.7)
MCV: 87 fL (ref 79–97)
Monocytes Absolute: 0.5 10*3/uL (ref 0.1–0.9)
Monocytes: 10 %
Neutrophils Absolute: 2.6 10*3/uL (ref 1.4–7.0)
Neutrophils: 51 %
Platelets: 207 10*3/uL (ref 150–450)
RBC: 5.64 x10E6/uL (ref 4.14–5.80)
RDW: 13.6 % (ref 11.6–15.4)
WBC: 5.1 10*3/uL (ref 3.4–10.8)

## 2023-06-07 LAB — TSH: TSH: 4.23 u[IU]/mL (ref 0.450–4.500)

## 2023-06-07 LAB — B12 AND FOLATE PANEL
Folate: 9.3 ng/mL (ref >3.0)
Vitamin B-12: 803 pg/mL (ref 232–1245)

## 2023-06-07 LAB — HEMOGLOBIN A1C
Est. average glucose Bld gHb Est-mCnc: 108 mg/dL
Hgb A1c MFr Bld: 5.4 % (ref 4.8–5.6)

## 2023-06-07 LAB — RPR: RPR Ser Ql: NONREACTIVE

## 2023-06-07 LAB — HIV ANTIBODY (ROUTINE TESTING W REFLEX): HIV Screen 4th Generation wRfx: NONREACTIVE

## 2023-06-07 LAB — VITAMIN D 25 HYDROXY (VIT D DEFICIENCY, FRACTURES): Vit D, 25-Hydroxy: 27.4 ng/mL — ABNORMAL LOW (ref 30.0–100.0)

## 2023-06-08 LAB — GC/CHLAMYDIA PROBE AMP
Chlamydia trachomatis, NAA: NEGATIVE
Neisseria Gonorrhoeae by PCR: NEGATIVE

## 2023-06-10 ENCOUNTER — Encounter: Payer: Self-pay | Admitting: Family Medicine

## 2023-06-10 ENCOUNTER — Ambulatory Visit (INDEPENDENT_AMBULATORY_CARE_PROVIDER_SITE_OTHER): Payer: BC Managed Care – PPO | Admitting: Family Medicine

## 2023-06-10 VITALS — BP 118/78 | HR 71 | Resp 16 | Ht 71.0 in | Wt 198.8 lb

## 2023-06-10 DIAGNOSIS — Z0000 Encounter for general adult medical examination without abnormal findings: Secondary | ICD-10-CM | POA: Diagnosis not present

## 2023-06-10 NOTE — Progress Notes (Signed)
 Name: Danny Fisher   MRN: [id number]    DOB: September 19, 1965   Date:06/10/2023       Progress Note  Subjective  Chief Complaint  Chief Complaint  Patient presents with   Annual Exam    HPI  Patient presents for annual CPE .   IPSS     Row Name 06/10/23 1317         International Prostate Symptom Score   How often have you had the sensation of not emptying your bladder? Not at All     How often have you had to urinate less than every two hours? Not at All     How often have you found you stopped and started again several times when you urinated? Not at All     How often have you found it difficult to postpone urination? Not at All     How often have you had a weak urinary stream? Not at All     How often have you had to strain to start urination? Not at All     How many times did you typically get up at night to urinate? 1 Time     Total IPSS Score 1       Quality of Life due to urinary symptoms   If you were to spend the rest of your life with your urinary condition just the way it is now how would you feel about that? Delighted              Diet: on a keto diet since Fall 2024, he had labs done but was not fasting and will repeat in one month  Exercise: discussed regular physical activity, not walking outside due to increase in pollen  Last Dental Exam: up to date Last Eye Exam: up to date   Depression: phq 9 is negative    06/03/2023    1:12 PM 11/02/2022    1:30 PM 06/08/2022    2:23 PM 05/03/2022    1:14 PM 03/26/2022   11:38 AM  Depression screen PHQ 2/9  Decreased Interest 0 0 0 0 0  Down, Depressed, Hopeless 0 0 0 0 0  PHQ - 2 Score 0 0 0 0 0  Altered sleeping 0 0 0 0   Tired, decreased energy 0 0 0 0   Change in appetite 0 0 0 0   Feeling bad or failure about yourself  0 0 0 0   Trouble concentrating 0 0 0 0   Moving slowly or fidgety/restless 0 0 0 0   Suicidal thoughts 0 0 0 0   PHQ-9 Score 0 0 0 0   Difficult doing work/chores Not difficult at all         Hypertension:  BP Readings from Last 3 Encounters:  06/10/23 118/78  06/03/23 114/70  11/02/22 120/62    Obesity: Wt Readings from Last 3 Encounters:  06/10/23 198 lb 12.8 oz (90.2 kg)  06/03/23 198 lb 11.2 oz (90.1 kg)  11/02/22 218 lb 4.8 oz (99 kg)   BMI Readings from Last 3 Encounters:  06/10/23 27.73 kg/m  06/03/23 27.71 kg/m  11/02/22 30.45 kg/m     [employer] Visit from 06/10/2023 in [facility]  AUDIT-C Score 1        Married - homosexual  STD testing and prevention (HIV/chl/gon/syphilis):  yes Sexual history: one partner  Hep C Screening: completed Skin cancer: Discussed monitoring for atypical lesions Colorectal cancer: up to  date  Prostate cancer:  not applicable   Lung cancer:  Low Dose CT Chest recommended if Age 66-80 years, 30 pack-year currently smoking OR have quit w/in 15years. Patient  is not a candidate for screening   AAA: The USPSTF recommends one-time screening with ultrasonography in men ages 45 to 75 years who have ever smoked. Patient   is not a candidate for screening  ECG:  next visit   Vaccines: reviewed with the patient.   Advanced Care Planning: A voluntary discussion about advance care planning including the explanation and discussion of advance directives.  Discussed health care proxy and Living will, and the patient was able to identify a health care proxy as husband Mellody Dance.  Patient does not have a living will and power of attorney of health care   Patient Active Problem List   Diagnosis Date Noted   Leg cramps 05/03/2022   Hypertension, benign 05/03/2022   Perennial allergic rhinitis with seasonal variation 05/03/2022   Elevated liver enzymes 05/03/2022   Dyslipidemia 11/22/2017   Tympanic membrane rupture, left 11/22/2017   Obesity (BMI 30.0-34.9) 11/22/2017   At risk for HIV due to homosexual contact 11/22/2017   ADD (attention deficit disorder) 11/22/2017    Past Surgical History:   Procedure Laterality Date    implant first metatarsophalangeal joint right foot Right 12/07/2019   Dr. Al Corpus    HERNIA REPAIR     INCISIONAL HERNIA REPAIR  1971   JOINT REPLACEMENT  12/07/19   Right big toe.    Family History  Problem Relation Age of Onset   Hyperlipidemia Mother    Anxiety disorder Mother    Obesity Father    Ovarian cancer Maternal Grandmother    Cancer Maternal Grandmother    Emphysema Maternal Grandfather        Tobacco User   Cancer Maternal Grandfather    Stroke Paternal Grandmother    Hearing loss Paternal Grandfather     Social History   Socioeconomic History   Marital status: Married    Spouse name: Mellody Dance   Number of children: 0   Years of education: Not on file   Highest education level: Bachelor's degree (e.g., BA, AB, BS)  Occupational History   Occupation: Market researcher   Tobacco Use   Smoking status: Former    Current packs/day: 0.00    Average packs/day: 0.5 packs/day for 25.7 years (12.8 ttl pk-yrs)    Types: Cigarettes    Start date: 03/22/1984    Quit date: 11/22/2009    Years since quitting: 13.5   Smokeless tobacco: Never  Vaping Use   Vaping status: Every Day   Start date: 11/23/2011   Substances: Nicotine, Flavoring  Substance and Sexual Activity   Alcohol use: Not Currently    Comment: very seldom    Drug use: Yes    Types: Marijuana    Comment: usually at night before sleep   Sexual activity: Yes    Partners: Male  Other Topics Concern   Not on file  Social History Narrative   He is married to [name], he has working for [employer] for since 2009    Social Drivers of [employer] Strain: Low Risk  (06/03/2023)   Overall Financial Resource Strain (CARDIA)    Difficulty of Paying Living Expenses: Not hard at all  Food Insecurity: No Food Insecurity (06/03/2023)   Hunger Vital Sign    Worried About Running Out of Food in the Last Year: Never true  Ran Out of Food in the Last Year: Never true   Transportation Needs: No Transportation Needs (06/03/2023)   PRAPARE - Administrator, Civil Service (Medical): No    Lack of Transportation (Non-Medical): No  Physical Activity: Insufficiently Active (06/03/2023)   Exercise Vital Sign    Days of Exercise per Week: 1 day    Minutes of Exercise per Session: 30 min  Stress: No Stress Concern Present (06/03/2023)   [employer] of Occupational Health - Occupational Stress Questionnaire    Feeling of Stress : Only a little  Social Connections: Moderately Isolated (06/03/2023)   Social Connection and Isolation Panel [NHANES]    Frequency of Communication with Friends and Family: More than three times a week    Frequency of Social Gatherings with Friends and Family: Once a week    Attends Religious Services: Never    Database administrator or Organizations: No    Attends Engineer, structural: Not on file    Marital Status: Married  Catering manager Violence: Not At Risk (06/10/2023)   Humiliation, Afraid, Rape, and Kick questionnaire    Fear of Current or Ex-Partner: No    Emotionally Abused: No    Physically Abused: No    Sexually Abused: No     Current Outpatient Medications:    atenolol (TENORMIN) 25 MG tablet, Take 1 tablet (25 mg total) by mouth daily., Disp: 90 tablet, Rfl: 1   Azelastine HCl 137 MCG/SPRAY SOLN, Place 2 sprays into the nose daily at 12 noon., Disp: 90 mL, Rfl: 0   DULoxetine (CYMBALTA) 60 MG capsule, Take 1 capsule (60 mg total) by mouth daily., Disp: 90 capsule, Rfl: 1   emtricitabine-tenofovir (TRUVADA) 200-300 MG tablet, Take 1 tablet by mouth daily., Disp: 90 tablet, Rfl: 1   ezetimibe (ZETIA) 10 MG tablet, Take 1 tablet (10 mg total) by mouth daily., Disp: 90 tablet, Rfl: 2   levocetirizine (XYZAL) 5 MG tablet, Take 1 tablet (5 mg total) by mouth every evening., Disp: 90 tablet, Rfl: 1   Multiple Vitamins-Minerals (MULTIVITAMIN WITH MINERALS) tablet, Take 1 tablet by mouth daily., Disp:  30 tablet, Rfl: 0   rosuvastatin (CRESTOR) 40 MG tablet, Take 1 tablet (40 mg total) by mouth daily., Disp: 90 tablet, Rfl: 1   valsartan-hydrochlorothiazide (DIOVAN-HCT) 80-12.5 MG tablet, Take 1 tablet by mouth daily., Disp: 90 tablet, Rfl: 1  No Known Allergies   ROS  Constitutional: Negative for fever or weight change.  Respiratory: Negative for cough and shortness of breath.   Cardiovascular: Negative for chest pain or palpitations.  Gastrointestinal: Negative for abdominal pain, no bowel changes.  Musculoskeletal: Negative for gait problem or joint swelling.  Skin: Negative for rash.  Neurological: Negative for dizziness or headache.  No other specific complaints in a complete review of systems (except as listed in HPI above).    Objective  Vitals:   06/10/23 1319  BP: 118/78  Pulse: 71  Resp: 16  SpO2: 100%  Weight: 198 lb 12.8 oz (90.2 kg)  Height: 5\' 11"  (1.803 m)    Body mass index is 27.73 kg/m.  Physical Exam  Constitutional: Patient appears well-developed and well-nourished. No distress.  HENT: Head: Normocephalic and atraumatic. Ears: B TMs ok, no erythema or effusion; Nose: Nose normal. Mouth/Throat: Oropharynx is clear and moist. No oropharyngeal exudate.  Eyes: Conjunctivae and EOM are normal. Pupils are equal, round, and reactive to light. No scleral icterus.  Neck: Normal range of motion. Neck supple.  No JVD present. No thyromegaly present.  Cardiovascular: Normal rate, regular rhythm and normal heart sounds.  No murmur heard. No BLE edema. Pulmonary/Chest: Effort normal and breath sounds normal. No respiratory distress. Abdominal: Soft. Bowel sounds are normal, no distension. There is no tenderness. no masses MALE GENITALIA: Normal descended testes bilaterally, no masses palpated, no hernias, no lesions, no discharge RECTAL: Prostate normal size and consistency, no rectal masses or hemorrhoids Musculoskeletal: Normal range of motion, no joint  effusions. No gross deformities Neurological: he is alert and oriented to person, place, and time. No cranial nerve deficit. Coordination, balance, strength, speech and gait are normal.  Skin: Skin is warm and dry. No rash noted. No erythema.  Psychiatric: Patient has a normal mood and affect. behavior is normal. Judgment and thought content normal.     Assessment & Plan  1. Well adult exam (Primary)  - NMR LipoProfile+Lipids+IR     -Prostate cancer screening and PSA options (with potential risks and benefits of testing vs not testing) were discussed along with recent recs/guidelines. -USPSTF grade A and B recommendations reviewed with patient; age-appropriate recommendations, preventive care, screening tests, etc discussed and encouraged; healthy living encouraged; see AVS for patient education given to patient -Discussed importance of 150 minutes of physical activity weekly, eat two servings of fish weekly, eat one serving of tree nuts ( cashews, pistachios, pecans, almonds.Marland Kitchen) every other day, eat 6 servings of fruit/vegetables daily and drink plenty of water and avoid sweet beverages.  -Reviewed Health Maintenance: yes

## 2023-07-01 DIAGNOSIS — J301 Allergic rhinitis due to pollen: Secondary | ICD-10-CM | POA: Diagnosis not present

## 2023-07-01 DIAGNOSIS — H6983 Other specified disorders of Eustachian tube, bilateral: Secondary | ICD-10-CM | POA: Diagnosis not present

## 2023-10-18 ENCOUNTER — Other Ambulatory Visit: Payer: Self-pay | Admitting: Family Medicine

## 2023-11-16 ENCOUNTER — Other Ambulatory Visit: Payer: Self-pay | Admitting: Family Medicine

## 2023-11-16 DIAGNOSIS — E785 Hyperlipidemia, unspecified: Secondary | ICD-10-CM

## 2023-11-16 DIAGNOSIS — F411 Generalized anxiety disorder: Secondary | ICD-10-CM

## 2023-12-09 ENCOUNTER — Ambulatory Visit: Admitting: Family Medicine

## 2023-12-31 ENCOUNTER — Other Ambulatory Visit: Payer: Self-pay | Admitting: Family Medicine

## 2024-01-02 NOTE — Telephone Encounter (Signed)
Due for a follow up.

## 2024-01-02 NOTE — Telephone Encounter (Signed)
 Lvm asking pt to return call to schedule appt

## 2024-01-16 ENCOUNTER — Other Ambulatory Visit: Payer: Self-pay | Admitting: Family Medicine

## 2024-01-17 NOTE — Telephone Encounter (Signed)
 Called lft vm to schedule appt
